# Patient Record
Sex: Male | Born: 1965 | Race: White | Hispanic: No | Marital: Married | Smoking: Current every day smoker
Health system: Southern US, Community
[De-identification: ages and names within clinical notes are randomized; demographics above are authoritative.]

## PROBLEM LIST (undated history)

## (undated) DIAGNOSIS — E7800 Pure hypercholesterolemia, unspecified: Secondary | ICD-10-CM

## (undated) DIAGNOSIS — T8859XA Other complications of anesthesia, initial encounter: Secondary | ICD-10-CM

## (undated) DIAGNOSIS — M5030 Other cervical disc degeneration, unspecified cervical region: Secondary | ICD-10-CM

## (undated) DIAGNOSIS — M419 Scoliosis, unspecified: Secondary | ICD-10-CM

## (undated) HISTORY — PX: ROTATOR CUFF REPAIR: SHX139

## (undated) HISTORY — PX: HERNIA REPAIR: SHX51

---

## 1998-07-21 ENCOUNTER — Emergency Department (HOSPITAL_COMMUNITY): Admission: EM | Admit: 1998-07-21 | Discharge: 1998-07-21 | Payer: Self-pay | Admitting: Emergency Medicine

## 2000-01-14 ENCOUNTER — Emergency Department (HOSPITAL_COMMUNITY): Admission: EM | Admit: 2000-01-14 | Discharge: 2000-01-14 | Payer: Self-pay | Admitting: *Deleted

## 2006-09-15 ENCOUNTER — Ambulatory Visit (HOSPITAL_COMMUNITY): Admission: RE | Admit: 2006-09-15 | Discharge: 2006-09-15 | Payer: Self-pay | Admitting: Surgery

## 2007-02-24 ENCOUNTER — Emergency Department (HOSPITAL_COMMUNITY): Admission: EM | Admit: 2007-02-24 | Discharge: 2007-02-25 | Payer: Self-pay | Admitting: Emergency Medicine

## 2007-02-25 ENCOUNTER — Inpatient Hospital Stay (HOSPITAL_COMMUNITY): Admission: EM | Admit: 2007-02-25 | Discharge: 2007-02-27 | Payer: Self-pay | Admitting: Emergency Medicine

## 2008-05-23 ENCOUNTER — Emergency Department (HOSPITAL_COMMUNITY): Admission: EM | Admit: 2008-05-23 | Discharge: 2008-05-23 | Payer: Self-pay | Admitting: Emergency Medicine

## 2011-03-11 NOTE — Discharge Summary (Signed)
NAME:  Derek Burns, Derek Burns NO.:  [id number]   MEDICAL RECORD NO.:  [id number]          PATIENT TYPE:  INP   LOCATION:  2007                         FACILITY:  [facility]   PHYSICIAN:  Derek Hilts. Jacinto Halim, MD       DATE OF BIRTH:  07/11/1966   DATE OF ADMISSION:  02/25/2007  DATE OF DISCHARGE:  02/27/2007                               DISCHARGE SUMMARY   HISTORY OF PRESENT ILLNESS:  [name] is a 45 year old male who came  in to the hospital because of chest pain.  He had partial relief of his  chest pain with Toradol.  He did have a significant family history, and  he smokes.  It was decided that he should undergo cardiac  catheterization because of continued chest pain.  His enzymes were  negative.  He understand a cardiac cath Feb 26, 2007.  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  Medical treatment was recommended.  He was seen  by Dr. Jacinto Burns on Feb 27, 2007.  He was considered stable for discharge  home.  His blood pressure was 147/87.  Dr. Jacinto Burns felt he could follow up  with his primary care doctor and he did not need to see Cardiology  unless she had further problems.   LABS:  Hemoglobin 13.5, hematocrit 40.3, platelets 164, WBC 7.6.  CK-MBs  and troponins were all negative.  His total cholesterol was 154,  triglycerides were 308, HDL is 28, LDL was 74.  CK-MBs and troponins  were all negative.  Chest x-ray showed no acute findings.   DISCHARGE MEDICATIONS:  1. Chantix 1 mg twice a day.  2. Pravastatin 40 mg at bedtime.  3. Aspirin 81 mg a day.  4. Enalapril 5 mg a day.  5. Relafen 500 mg twice per day.  6. Darvocet every 6 hours as needed for pain.   He should follow up with his primary care physician, Dr. Leanord Burns, in 2  weeks.  He should do no strenuous activity, lifting, pushing, pulling,  or exercising for 5 days.   DISCHARGE DIAGNOSES:  1. Chest pain, not cardiac ischemic related, status  post cardiac      catheterization with no obstructive disease.  2. Hypertension.  3. Positive family of coronary artery disease.  4. Tobacco smoking.  5. Hyperlipidemia.      Derek Burns, N.P.      Derek Hilts. Jacinto Halim, MD  Electronically Signed    BB/MEDQ  D:  03/29/2007  T:  03/30/2007  Job:  [id number]   cc:   Derek Burns, M.D.

## 2011-03-14 NOTE — Op Note (Signed)
NAME:  EZZARD, DITMER NO.:  [id number]   MEDICAL RECORD NO.:  [id number]          PATIENT TYPE:  AMB   LOCATION:  DAY                          FACILITY:  [facility]   PHYSICIAN:  Ardeth Sportsman, MD     DATE OF BIRTH:  [birth year]   DATE OF PROCEDURE:  09/15/2006  DATE OF DISCHARGE:                                 OPERATIVE REPORT   PREOPERATIVE DIAGNOSIS:  Left inguinal hernia, possible bilateral inguinal  hernias.   POSTOPERATIVE DIAGNOSES:  1. Left direct inguinal hernia.  2. Right indirect inguinal hernia.   OPERATION PERFORMED:  Laparoscopic preperitoneal repair of bilateral  inguinal hernias (TEP with 6 x 6 inch Parietex mesh).   SURGEON:  Ardeth Sportsman, MD   ASSISTANT:  Gita Kudo, M.D.   SPECIMENS:  None.   DRAINS:  None.   ESTIMATED BLOOD LOSS:  Less than 5 mL.   ANESTHESIA:  1. General.  2. Bilateral ilioinguinal and genitofemoral nerve blocks as well as cord      blocks as well as a field block around all port sites.   COMPLICATIONS:  None apparent.   INDICATIONS FOR PROCEDURE:  Mr. Lukacs is a 45 year old male with an  obvious left inguinal hernia and examination concerning for right inguinal  hernia.  The anatomy and embryology, abdominal wall formation, testicular  migration was explained.  Past physiology of inguinal herniation was  explained, recommendations made for hernia repair.  Options were discussed  and final recommendation for a laparoscopic preperitoneal repair was  explained.   Risks of stroke, heart attack, deep venous thrombosis, pulmonary embolism,  and death were discussed.  The risks such as bleeding, ecchymosis and need  for transfusion, wound infection, abscess, injury to other organs, hernia  recurrence, urinary retention requiring Foley catheterization, testicular  injury resulting in loss and other risks were discussed.  He understood  these risks and wished to proceed.   OPERATIVE FINDINGS:  He had an  obvious direct inguinal hernia on the left  side with no major incarceration occurring.  The right side he had indirect  hernia with a very large internal inguinal ring and a hernia sac coming up  into the ring with no evidence of any incarceration either.   DESCRIPTION OF PROCEDURE:  Informed consent was confirmed.  The patient  received preoperative antibiotics.  He underwent general anesthesia without  difficulty.  He was positioned supine with both arms tucked.  His abdomen  was prepped and draped in sterile fashion.   Entry was gained into the preperitoneal cavity through an infraumbilical  curvilinear incision about 2 cm long.  Anterior rectus sheath was nicked  both right and left of midline and the linea alba was freed off the  peritoneum.  A 10 mm port was passed posterior to rectus sheaths.  Capnopreperitoneum was induced to 15 mmHg.  Camera dissection was done to  help free the peritoneum off the right lower and left lower quadrants of the  abdomen such that 5 mm working ports could be placed in the right and left  mid abdomen.   Attention was  turned first to the left side, the obvious site.  The  peritoneum was freed off the anterior abdominal wall.  Fatty lipomas could  be seen going up into a defect medial to the inferior epigastric vessels and  just lateral to the insertion of the rectus abdominis muscles onto the pubis  bone consistent with a direct inguinal hernia.  The inguinal cord structures  could be identified.  There was some peritoneum crawling up on the cord  structures but not made it into the internal inguinal ring and thus this was  not a combined inguinal hernia.  Peritoneum was freed off the cord and  pelvic brim as inferoposteriorly as possible.  Window was made between the  posterior bladder and the posterior wall near the area of obturator  foramina.   Dissection was done in a mirror image fashion on the right side.  There was  no direct defect.  However, the internal inguinal ring was much more dilated  and there was obvious peritoneum crawling up with the inguinal structures  consistent with an indirect hernia.  This hernia sac was able to be freed  off as inferoposteriorly as possible.   6 x 6 polypropylene mesh was cut in a half-skull shape and placed one on  each side such that the medial inferior flap tucked covering the obturator  foramina of the posterior pelvic wall lateral to the bladder.  The  peritoneum was elevated cephalad as mesh was laid as posteriorly and  inferiorly as possible.  The peritoneum laid on top of it allowing the mesh  to be tucked well.  Mesh lay well laterally as well as superiorly and  medially such that there was at least three inches of coverage around the  left direct and the right indirect defects.  Lead point on especially the  left side was grasped and elevated cephalad as well as lead point on the  right side was elevated cephalad as well.  Capnopreperitoneum was released  with the mesh lying well. The fascial defect was closed using 0 Vicryl in a  figure-of-eight stitch.  Skin was closed using 4-0 Monocryl stitch.  Sterile  dressing was applied.  The patient was then sent to the recovery room in  stable condition.   I explained the operative findings to the patient's wife and family and  later to the patient himself.  Postoperative instructions were given.  They  expressed understanding and appreciation.   ADDENDUM:  On the right side a small defect was made into the peritoneum as  the indirect hernia sac was peeled down.  This was closed using a 0 Vicryl  stitch using intracorporeal suturing and tying.      Ardeth Sportsman, MD  Electronically Signed     SCG/MEDQ  D:  09/15/2006  T:  09/15/2006  Job:  [id number]

## 2011-03-14 NOTE — Cardiovascular Report (Signed)
NAME:  YOUSEF, HUGE.:  [id number]   MEDICAL RECORD NO.:  [id number]          PATIENT TYPE:  INP   LOCATION:  2007                         FACILITY:  [facility]   PHYSICIAN:  Nicki Guadalajara, M.D.     DATE OF BIRTH:  [birth year]   DATE OF PROCEDURE:  DATE OF DISCHARGE:                            CARDIAC CATHETERIZATION   INDICATIONS:  Derek Burns is a 45 year old gentleman who has  experienced recurrent episodes of chest pain.  There is a positive  history of tobacco use as well as family history of coronary artery  disease.  He had a mildly abnormal D-dimer and negative CT scan.  In  light of cardiac risk factors, definitive diagnostic cardiac  catheterization was recommended.   PROCEDURE:  After premedication with Versed for total of 3 mg  intravenously, the patient prepped and draped in usual fashion.  Right  femoral artery was punctured anteriorly and a 5-French sheath was  inserted without difficulty.  Diagnostic catheterization was done  utilizing 5-French  Judkins left and right coronary catheters.  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  Pigtail catheter was used for biplane cine left  ventriculography.  Central aortography was also performed to further  elucidate the anomalous left circumflex takeoff.  In addition, during  the left coronary injection, the patient did receive 200 mcg of  intracoronary nitroglycerin to see if the mild luminal narrowing of the  proximal LAD was spasm or potentially a fixed obstruction.  Hemostasis  was obtained by direct manual pressure.   HEMODYNAMIC DATA:  Central aortic pressure is 116/70, left ventricular  pressure 116/13.   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  There was a  proximal smooth 10-20% narrowing before the first septal perforating  artery.  The LAD gave rise to a large diagonal system.  The LAD extended  to the LV apex.  200 mcg IC nitroglycerin was administered down the LAD.  This did not significantly changed the mild luminal narrowing proximally  but additional dilation was noted in the remaining portion of the LAD  diagonal system.   The left circumflex vessel was a diminutive vessel which arose  anomalously from the origin of the right coronary artery and was  approximately a 1.0 size vessel.   The right coronary artery was a very large dominant vessel that gave  rise to a large acute marginal branch, a large posterior descending  artery extending to the apex, and a large bifurcating posterolateral  vessel supplying the posterior lateral wall.  The right coronary artery  and its branches were angiographically normal.   Biplane cine left ventriculography revealed normal LV contractility  without focal segmental wall motion abnormality.  Central aortography  was also performed which allowed improved visualization of the very  small circumflex vessel which arose off the right coronary artery.  The  vessel was very small caliber and gave rise to a diminutive small  marginal branch.   IMPRESSION:  1. Normal LV function.  2. Mild 10-20% smooth narrowing in the proximal LAD which was a single      vessel arising from the left coronary cusp with 10-20% narrowing      not significantly improved with intercoronary nitroglycerin      administration.  3. 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  4. Very large dominant right coronary artery.   RECOMMENDATIONS:  Medical therapy.  Most likely the patient's chest pain  was noncardiac in etiology.  The patient will require risk factor  modification.            ______________________________  Nicki Guadalajara, M.D.     TK/MEDQ  D:  02/26/2007  T:  02/26/2007  Job:  [id number]   cc:   Dr. Len Childs, M.D.

## 2011-04-08 ENCOUNTER — Emergency Department (HOSPITAL_COMMUNITY)
Admission: EM | Admit: 2011-04-08 | Discharge: 2011-04-08 | Disposition: A | Discharge status: Home / Self Care | Payer: Self-pay | Attending: Emergency Medicine | Admitting: Emergency Medicine

## 2011-04-08 DIAGNOSIS — S61209A Unspecified open wound of unspecified finger without damage to nail, initial encounter: Secondary | ICD-10-CM | POA: Insufficient documentation

## 2011-04-08 DIAGNOSIS — W268XXA Contact with other sharp object(s), not elsewhere classified, initial encounter: Secondary | ICD-10-CM | POA: Insufficient documentation

## 2011-07-25 LAB — POCT I-STAT, CHEM 8
BUN: 12
Calcium, Ion: 1.06 — ABNORMAL LOW
HCT: 51
Sodium: 139
TCO2: 26

## 2012-11-10 ENCOUNTER — Encounter (HOSPITAL_COMMUNITY): Payer: Self-pay | Admitting: Emergency Medicine

## 2012-11-10 ENCOUNTER — Emergency Department (HOSPITAL_COMMUNITY)
Admission: EM | Admit: 2012-11-10 | Discharge: 2012-11-10 | Disposition: A | Discharge status: Home / Self Care | Payer: PRIVATE HEALTH INSURANCE | Attending: Emergency Medicine | Admitting: Emergency Medicine

## 2012-11-10 ENCOUNTER — Emergency Department (HOSPITAL_COMMUNITY): Payer: PRIVATE HEALTH INSURANCE

## 2012-11-10 DIAGNOSIS — W268XXA Contact with other sharp object(s), not elsewhere classified, initial encounter: Secondary | ICD-10-CM | POA: Insufficient documentation

## 2012-11-10 DIAGNOSIS — F17200 Nicotine dependence, unspecified, uncomplicated: Secondary | ICD-10-CM | POA: Insufficient documentation

## 2012-11-10 DIAGNOSIS — Y939 Activity, unspecified: Secondary | ICD-10-CM | POA: Insufficient documentation

## 2012-11-10 DIAGNOSIS — Y929 Unspecified place or not applicable: Secondary | ICD-10-CM | POA: Insufficient documentation

## 2012-11-10 DIAGNOSIS — Z8639 Personal history of other endocrine, nutritional and metabolic disease: Secondary | ICD-10-CM | POA: Insufficient documentation

## 2012-11-10 DIAGNOSIS — S61111A Laceration without foreign body of right thumb with damage to nail, initial encounter: Secondary | ICD-10-CM

## 2012-11-10 DIAGNOSIS — S61209A Unspecified open wound of unspecified finger without damage to nail, initial encounter: Secondary | ICD-10-CM | POA: Insufficient documentation

## 2012-11-10 DIAGNOSIS — Z862 Personal history of diseases of the blood and blood-forming organs and certain disorders involving the immune mechanism: Secondary | ICD-10-CM | POA: Insufficient documentation

## 2012-11-10 DIAGNOSIS — R209 Unspecified disturbances of skin sensation: Secondary | ICD-10-CM | POA: Insufficient documentation

## 2012-11-10 HISTORY — DX: Pure hypercholesterolemia, unspecified: E78.00

## 2012-11-10 MED ORDER — ONDANSETRON 4 MG PO TBDP
4.0000 mg | ORAL_TABLET | Freq: Once | ORAL | Status: AC
  Administered 2012-11-10: 4 mg via ORAL
  Filled 2012-11-10: qty 1

## 2012-11-10 MED ORDER — LIDOCAINE HCL 2 % IJ SOLN
10.0000 mL | Freq: Once | INTRAMUSCULAR | Status: AC
  Administered 2012-11-10: 200 mg via INTRADERMAL

## 2012-11-10 MED ORDER — CEPHALEXIN 500 MG PO CAPS: 500.0000 mg | ORAL_CAPSULE | Freq: Four times a day (QID) | ORAL | Status: DC

## 2012-11-10 MED ORDER — OXYCODONE-ACETAMINOPHEN 5-325 MG PO TABS
1.0000 | ORAL_TABLET | Freq: Once | ORAL | Status: AC
  Administered 2012-11-10: 1 via ORAL
  Filled 2012-11-10: qty 1

## 2012-11-10 MED ORDER — OXYCODONE-ACETAMINOPHEN 5-325 MG PO TABS: 1.0000 | ORAL_TABLET | Freq: Four times a day (QID) | ORAL | Status: DC | PRN

## 2012-11-10 NOTE — ED Notes (Signed)
Patient states feels slight nausea. States might be hungry. PA notified. States last tetanus approximately 5 years ago stated by patient and family member now at bedside.

## 2012-11-10 NOTE — ED Provider Notes (Signed)
History     CSN: [id number]  Arrival date & time 11/10/12  1039   First MD Initiated Contact with Patient 11/10/12 1053      Chief Complaint  Patient presents with  . Laceration    (Consider location/radiation/quality/duration/timing/severity/associated sxs/prior treatment) Patient is a 47 y.o. male presenting with skin laceration. The history is provided by the patient. No language interpreter was used.  Laceration  The incident occurred less than 1 hour ago. Pain location: right thumb. The laceration is 4 cm in size. The laceration mechanism was a a blunt object (changing a strut). The pain is at a severity of 10/10. The pain is severe. The pain has been constant since onset. He reports no foreign bodies present. His tetanus status is UTD.    Past Medical History  Diagnosis Date  . High cholesterol     No past surgical history on file.  No family history on file.  History  Substance Use Topics  . Smoking status: Current Every Day Smoker  . Smokeless tobacco: Not on file  . Alcohol Use:       Review of Systems  Constitutional: Negative for fever.  Skin: Positive for wound. Negative for rash.  Neurological: Positive for numbness.    Allergies  Review of patient's allergies indicates no known allergies.  Home Medications  No current outpatient prescriptions on file.  BP 144/83  Pulse 98  Temp 98.6 F (37 C)  Resp 16  SpO2 99%  Physical Exam  Nursing note and vitals reviewed. Constitutional: He is oriented to person, place, and time. He appears well-developed and well-nourished. No distress.  HENT:  Head: Atraumatic.  Eyes: Conjunctivae normal are normal.  Neck: Neck supple.  Musculoskeletal: Normal range of motion. He exhibits tenderness (R thumb: 4cm circumferential laceration to tip of finger with nail involvement, in flap formation. no joint involvement decreased sensation distally).  Neurological: He is alert and oriented to person, place, and time.   Skin: Skin is warm.  Psychiatric: He has a normal mood and affect.    ED Course  LACERATION REPAIR Date/Time: 11/10/2012 12:10 PM Performed by: Fayrene Helper Authorized by: Fayrene Helper Consent: Verbal consent obtained. Written consent not obtained. Risks and benefits: risks, benefits and alternatives were discussed Consent given by: patient Patient understanding: patient states understanding of the procedure being performed Patient consent: the patient's understanding of the procedure matches consent given Procedure consent: procedure consent matches procedure scheduled Relevant documents: relevant documents present and verified Site marked: the operative site was marked Imaging studies: imaging studies available Patient identity confirmed: verbally with patient and arm band Time out: Immediately prior to procedure a "time out" was called to verify the correct patient, procedure, equipment, support staff and site/side marked as required. Location: R thumb. Laceration length: 4 cm Contamination: The wound is contaminated. Foreign bodies: no foreign bodies Tendon involvement: none Nerve involvement: superficial Vascular damage: yes Anesthesia: nerve block Local anesthetic: lidocaine 2% without epinephrine Anesthetic total: 8 ml Preparation: Patient was prepped and draped in the usual sterile fashion. Irrigation solution: saline Irrigation method: syringe Amount of cleaning: extensive Debridement: minimal Degree of undermining: minimal Skin closure: 4-0 nylon Number of sutures: 8 Technique: simple Approximation: close Approximation difficulty: simple Dressing: 4x4 sterile gauze, non-adhesive packing strip, pressure dressing and splint Patient tolerance: Patient tolerated the procedure well with no immediate complications.   (including critical care time)  Labs Reviewed - No data to display Dg Finger Thumb Right  11/10/2012  *RADIOLOGY REPORT*  Clinical  Data: Distal thumb  injury  RIGHT THUMB 2+V  Comparison: None.  Findings: Three views of the right thumb submitted.  There is a vague lucent line at the base of the distal phalanx suspicious for nondisplaced fracture.  Clinical correlation is necessary.  IMPRESSION: Vague lucent line at the base of distal phalanx suspicious for nondisplaced fracture.  Clinical correlation is necessary.   Original Report Authenticated By: Natasha Mead, M.D.      No diagnosis found.  1. R thumb laceration, nondisplaced fx.  MDM  Pt injured his R thumb when he was changing a car strut.  Suffered a flap injury to distal tip of R thumb with near complete laceration.  Has mild paresthesia distal to site and tip look dusky.  Wound were thoroughly irrigated.  Xray shows suspect nondisplaced fx.  Pt has no subungal hematoma.  Wound were sutured with good alignment.  Will dc with finger splint, abx, pain meds and referral to hand.  Pt aware to return in 10 days for sutures removal.  To return sooner if there are signs of infection.  Care discussed with my attending.  4:24 PM Attempted to contact hand specialist without return call.  Pt is made aware to f/u with hand specialist, Dr. Melvyn Novas.  Wife acknowledge the importance of f/u.    BP 144/83  Pulse 98  Temp 98.6 F (37 C)  Resp 16  SpO2 99%  I have reviewed nursing notes and vital signs. I personally reviewed the imaging tests through PACS system  I reviewed available ER/hospitalization records thought the EMR       Fayrene Helper, PA-C 11/10/12 1214  Fayrene Helper, PA-C 11/10/12 1629

## 2012-11-10 NOTE — Progress Notes (Signed)
Orthopedic Tech Progress Note Patient Details:  Derek Burns May 15, 1966 [id number]  Ortho Devices Type of Ortho Device: Finger splint Ortho Device/Splint Location: RIGHT THUMB Ortho Device/Splint Interventions: Application   Cammer, Mickie Bail 11/10/2012, 1:05 PM

## 2012-11-10 NOTE — ED Notes (Signed)
Rt thumb got smashed in a strut that broke lose  Bleeding oozing athis time   Tip of rt thumb is numb at this time

## 2012-11-10 NOTE — ED Notes (Signed)
Cleaned right thumb with wound cleanser again after suture placement and placed bacitracin on right thumb. Patient tolerated without incident.

## 2012-11-10 NOTE — ED Notes (Signed)
Patient transported to X-ray 

## 2012-11-10 NOTE — ED Provider Notes (Signed)
Medical screening examination/treatment/procedure(s) were performed by non-physician practitioner and as supervising physician I was immediately available for consultation/collaboration.  Glynn Octave, MD 11/10/12 [phone]

## 2014-04-07 ENCOUNTER — Other Ambulatory Visit: Payer: Self-pay | Admitting: Orthopaedic Surgery

## 2014-04-07 DIAGNOSIS — M545 Low back pain, unspecified: Secondary | ICD-10-CM

## 2014-04-11 ENCOUNTER — Ambulatory Visit
Admission: RE | Admit: 2014-04-11 | Discharge: 2014-04-11 | Disposition: A | Discharge status: Home / Self Care | Payer: PRIVATE HEALTH INSURANCE | Source: Ambulatory Visit | Attending: Orthopaedic Surgery | Admitting: Orthopaedic Surgery

## 2014-04-11 DIAGNOSIS — M545 Low back pain, unspecified: Secondary | ICD-10-CM

## 2014-08-19 ENCOUNTER — Emergency Department (HOSPITAL_COMMUNITY)
Admission: EM | Admit: 2014-08-19 | Discharge: 2014-08-19 | Disposition: A | Discharge status: Home / Self Care | Payer: 59 | Attending: Emergency Medicine | Admitting: Emergency Medicine

## 2014-08-19 ENCOUNTER — Emergency Department (HOSPITAL_COMMUNITY): Payer: 59

## 2014-08-19 ENCOUNTER — Encounter (HOSPITAL_COMMUNITY): Payer: Self-pay | Admitting: Emergency Medicine

## 2014-08-19 DIAGNOSIS — Y9389 Activity, other specified: Secondary | ICD-10-CM | POA: Insufficient documentation

## 2014-08-19 DIAGNOSIS — S90112A Contusion of left great toe without damage to nail, initial encounter: Secondary | ICD-10-CM | POA: Diagnosis not present

## 2014-08-19 DIAGNOSIS — S99922A Unspecified injury of left foot, initial encounter: Secondary | ICD-10-CM | POA: Diagnosis present

## 2014-08-19 DIAGNOSIS — Z8639 Personal history of other endocrine, nutritional and metabolic disease: Secondary | ICD-10-CM | POA: Diagnosis not present

## 2014-08-19 DIAGNOSIS — S99929A Unspecified injury of unspecified foot, initial encounter: Secondary | ICD-10-CM

## 2014-08-19 DIAGNOSIS — Z720 Tobacco use: Secondary | ICD-10-CM | POA: Insufficient documentation

## 2014-08-19 DIAGNOSIS — M25572 Pain in left ankle and joints of left foot: Secondary | ICD-10-CM

## 2014-08-19 DIAGNOSIS — M79672 Pain in left foot: Secondary | ICD-10-CM

## 2014-08-19 DIAGNOSIS — Y92488 Other paved roadways as the place of occurrence of the external cause: Secondary | ICD-10-CM | POA: Insufficient documentation

## 2014-08-19 MED ORDER — IBUPROFEN 600 MG PO TABS: 600.0000 mg | ORAL_TABLET | Freq: Four times a day (QID) | ORAL | Status: DC | PRN

## 2014-08-19 MED ORDER — OXYCODONE-ACETAMINOPHEN 5-325 MG PO TABS
1.0000 | ORAL_TABLET | Freq: Once | ORAL | Status: AC
  Administered 2014-08-19: 1 via ORAL
  Filled 2014-08-19: qty 1
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
## 2014-08-19 NOTE — ED Notes (Signed)
Ankle x-ray ordered because PT now reports ankle pain.

## 2014-08-19 NOTE — Discharge Instructions (Signed)
Subungual Hematoma A subungual hematoma is a pocket of blood that collects under the fingernail or toenail. The pressure created by the blood under the nail can cause pain. CAUSES  A subungual hematoma occurs when an injury to the finger or toe causes a blood vessel beneath the nail to break. The injury can occur from a direct blow such as slamming a finger in a door. It can also occur from a repeated injury such as pressure on the foot in a shoe while running. A subungual hematoma is sometimes called runner's toe or tennis toe. SYMPTOMS   Blue or dark blue skin under the nail.  Pain or throbbing in the injured area. DIAGNOSIS  Your caregiver can determine whether you have a subungual hematoma based on your history and a physical exam. If your caregiver thinks you might have a broken (fractured) bone, X-rays may be taken. TREATMENT  Hematomas usually go away on their own over time. Your caregiver may make a hole in the nail to drain the blood. Draining the blood is painless and usually provides significant relief from pain and throbbing. The nail usually grows back normally after this procedure. In some cases, the nail may need to be removed. This is done if there is a cut under the nail that requires stitches (sutures). HOME CARE INSTRUCTIONS   Put ice on the injured area.  Put ice in a plastic bag.  Place a towel between your skin and the bag.  Leave the ice on for 15-20 minutes, 03-04 times a day for the first 1 to 2 days.  Elevate the injured area to help decrease pain and swelling.  If you were given a bandage, wear it for as long as directed by your caregiver.  If part of your nail falls off, trim the remaining nail gently. This prevents the nail from catching on something and causing further injury.  Only take over-the-counter or prescription medicines for pain, discomfort, or fever as directed by your caregiver. SEEK IMMEDIATE MEDICAL CARE IF:   You have redness or swelling  around the nail.  You have yellowish-white fluid (pus) coming from the nail.  Your pain is not controlled with medicine.  You have a fever. MAKE SURE YOU:  Understand these instructions.  Will watch your condition.  Will get help right away if you are not doing well or get worse. Document Released: 10/10/2000 Document Revised: 01/05/2012 Document Reviewed: 10/01/2011 [facility] Patient Information 2015 [place], [place]. This information is not intended to replace advice given to you by your health care provider. Make sure you discuss any questions you have with your health care provider.  Ankle Pain Ankle pain is a common symptom. The bones, cartilage, tendons, and muscles of the ankle joint perform a lot of work each day. The ankle joint holds your body weight and allows you to move around. Ankle pain can occur on either side or back of 1 or both ankles. Ankle pain may be sharp and burning or dull and aching. There may be tenderness, stiffness, redness, or warmth around the ankle. The pain occurs more often when a person walks or puts pressure on the ankle. CAUSES  There are many reasons ankle pain can develop. It is important to work with your caregiver to identify the cause since many conditions can impact the bones, cartilage, muscles, and tendons. Causes for ankle pain include:  Injury, including a break (fracture), sprain, or strain often due to a fall, sports, or a high-impact activity.  Swelling (  inflammation) of a tendon (tendonitis).  Achilles tendon rupture.  Ankle instability after repeated sprains and strains.  Poor foot alignment.  Pressure on a nerve (tarsal tunnel syndrome).  Arthritis in the ankle or the lining of the ankle.  Crystal formation in the ankle (gout or pseudogout). DIAGNOSIS  A diagnosis is based on your medical history, your symptoms, results of your physical exam, and results of diagnostic tests. Diagnostic tests may include X-ray exams or a  computerized magnetic scan (magnetic resonance imaging, MRI). TREATMENT  Treatment will depend on the cause of your ankle pain and may include:  Keeping pressure off the ankle and limiting activities.  Using crutches or other walking support (a cane or brace).  Using rest, ice, compression, and elevation.  Participating in physical therapy or home exercises.  Wearing shoe inserts or special shoes.  Losing weight.  Taking medications to reduce pain or swelling or receiving an injection.  Undergoing surgery. HOME CARE INSTRUCTIONS   Only take over-the-counter or prescription medicines for pain, discomfort, or fever as directed by your caregiver.  Put ice on the injured area.  Put ice in a plastic bag.  Place a towel between your skin and the bag.  Leave the ice on for 15-20 minutes at a time, 03-04 times a day.  Keep your leg raised (elevated) when possible to lessen swelling.  Avoid activities that cause ankle pain.  Follow specific exercises as directed by your caregiver.  Record how often you have ankle pain, the location of the pain, and what it feels like. This information may be helpful to you and your caregiver.  Ask your caregiver about returning to work or sports and whether you should drive.  Follow up with your caregiver for further examination, therapy, or testing as directed. SEEK MEDICAL CARE IF:   Pain or swelling continues or worsens beyond 1 week.  You have an oral temperature above 102 F (38.9 C).  You are feeling unwell or have chills.  You are having an increasingly difficult time with walking.  You have loss of sensation or other new symptoms.  You have questions or concerns. MAKE SURE YOU:   Understand these instructions.  Will watch your condition.  Will get help right away if you are not doing well or get worse. Document Released: 04/02/2010 Document Revised: 01/05/2012 Document Reviewed: 04/02/2010 [facility] Patient Information  2015 [place], [place]. This information is not intended to replace advice given to you by your health care provider. Make sure you discuss any questions you have with your health care provider.   Emergency Department Resource Guide 1) Find a Doctor and Pay Out of Pocket Although you won't have to find out who is covered by your insurance plan, it is a good idea to ask around and get recommendations. You will then need to call the office and see if the doctor you have chosen will accept you as a new patient and what types of options they offer for patients who are self-pay. Some doctors offer discounts or will set up payment plans for their patients who do not have insurance, but you will need to ask so you aren't surprised when you get to your appointment.  2) Contact Your Local Health Department Not all health departments have doctors that can see patients for sick visits, but many do, so it is worth a call to see if yours does. If you don't know where your local health department is, you can check in your phone book. The  CDC also has a tool to help you locate your state's health department, and many state websites also have listings of all of their local health departments.  3) Find a Walk-in Clinic If your illness is not likely to be very severe or complicated, you may want to try a walk in clinic. These are popping up all over the country in pharmacies, drugstores, and shopping centers. They're usually staffed by nurse practitioners or physician assistants that have been trained to treat common illnesses and complaints. They're usually fairly quick and inexpensive. However, if you have serious medical issues or chronic medical problems, these are probably not your best option.  No Primary Care Doctor: - Call Health Connect at  [phone] - they can help you locate a primary care doctor that  accepts your insurance, provides certain services, etc. - Physician Referral Service- [phone]  Chronic  Pain Problems: Organization         Address  Phone   Notes  [facility]  [phone] Patients need to be referred by their primary care doctor.   Medication Assistance: Organization         Address  Phone   Notes  [facility] Medication Lifescapessistance Program [address] [place], [place] [address] [phone]) [phone] --Must be a resident of [facility] [place] -- Must have NO insurance coverage whatsoever (no Medicaid/ Medicare, etc.) -- The pt. MUST have a primary care doctor that directs their care regularly and follows them in the community   MedAssist  [phone]) [phone]   [employer]  [phone]    Agencies that provide inexpensive medical care: Organization         Address  Phone   Notes  [facility]  [phone]) [phone]   [facility]    [phone]   [facility] [address] [place], [place] [address] [phone]   [facility] [address], [place] [phone]) [phone]   [facility]anned [facility]    [phone]) [phone]   [facility]    [phone]   [facility]  [address], [place] Phone:  [phone], Fax:  [phone] Hours of Operation:  9 am - 6 pm, M-F.  Also accepts Medicaid/Medicare and self-pay.  [facility]  [address], [place] Phone: [phone], Fax: [phone]. Hours of Operation:  8:30 am - 5:30 pm, M-F.  Also accepts Medicaid and self-pay.  [facility] [place] [address], [place] Phone: [phone](336) [phone]   [facility] [address], [place], [place] [phone], Ext. [phone] Mondays & Thursdays: 7-9 AM.  First 15 patients are seen on a first come, first serve basis.    Medicaid-accepting [facility] Providers:  Organization         Address  Phone   Notes  [facility] [address], [place] [phone] Also  accepts self-pay patients.  [facility] [address], [place]  [phone]   [facility] [address], [place] [phone]) [phone]   [facility] [address] [place] [address], [place] [phone]) [phone]   Renaye RakersVeita Bland [address], [place]   [phone]) [phone] Only accepts [place] Access [place] patients after they have their name applied to their card.   Self-Pay (no  insurance) in [place]:  Organization         Address  Phone   Notes  Sickle Cell Patients, [facility] [address] [place], [place] [phone]   [facility] [address] [place], [place] [phone]   [facility]  [address], [place] [phone]   [facility]/Dr. Osei-Bonsu  [address], [place] or [address], [place] [phone] Phone number for both [place] and [place] locations is the same.  Urgent Medical and [facility] [address], [place] [phone]   [facility] [address] [place] or [address] [phone]   [facility] [address], [place] [phone], phone; [phone], fax Sees patients 1st and 3rd Saturday of every month.  Must not qualify for public or private insurance (i.e. Medicaid, Medicare, [place] Health Choice, Veterans' Benefits)  Household income should be no more than 200% of the poverty level The clinic cannot treat you if you are pregnant or think you are pregnant  Sexually transmitted diseases are not treated at the clinic.    Dental Care: Organization         Address  Phone  Notes  [facility] of [facility] [address] [place], [place] [phone] Accepts children up to age 13 who are enrolled in [place] or [place] Health Choice; pregnant  women with a Medicaid card; and children who have applied for Medicaid or [place] Health Choice, but were declined, whose parents can pay a reduced fee at time of service.  [facility] Department of [facility]  [address], [place] [phone] Accepts children up to age 50 who are enrolled in [place] or [place] Health Choice; pregnant women with a Medicaid card; and children who have applied for Medicaid or [place] Health Choice, but were declined, whose parents can pay a reduced fee at time of service.  [facility]  [address] [place], [place] [phone] Patients are seen by appointment only. Walk-ins are not accepted. [facility] will see patients 56 years of age and older. Monday - Tuesday (8am-5pm) Most Wednesdays (8:30-5pm) $30 per visit, cash only  [facility] Adult Dental Access PROGRAM  [address], [facility] [phone] Patients are seen by appointment only. Walk-ins are not accepted. [facility] will see patients 39 years of age and older. One Wednesday Evening (Monthly: Volunteer Based).  $30 per visit, cash only  [employer]  [phone] for adults; Children under age 35, call Graduate Pediatric Dentistry at [phone]. Children aged 91-14, please call [phone] to request a pediatric application.  Dental services are provided in all areas of dental care including fillings, crowns and bridges, complete and partial dentures, implants, gum treatment, root canals, and extractions. Preventive care is also provided. Treatment is provided to both adults and children. Patients are selected via a lottery and there is often a waiting list.   [facility] [address], [place]  [phone] [email or web address]   [facility] [address] [place], [place] [phone], Ext. [phone] Second and Fourth Thursday of each month, opens at 6:30 AM; Clinic ends at 9 AM.  Patients are  seen on a first-come first-served basis, and a limited number are seen  during each clinic.   [facility]  [address] [place], [place] [phone]   Eligibility Requirements You must have lived in [place], [place], or [place] counties for at least the last three months.   You cannot be eligible for state or federal sponsored [place], including [employer], [place], or [employer].   You generally cannot be eligible for healthcare insurance through your employer.    How to apply: Eligibility screenings are held every Tuesday and Wednesday afternoon from 1:00 pm until 4:00 pm. You do not need an appointment for the interview!  [facility] [address] [place], [place] [phone]   [facility] Health Department  [phone]   [facility] Health Department  [phone]   [facility] Health Department  [phone]    Behavioral Health Resources in the Community: Intensive Outpatient Programs Organization         Address  Phone  Notes  [facility] [address] [place], [place] [phone]   [facility] Outpatient [address] [place], [place] [phone]   [facility]: [facility] [address] [place], [place]  [phone]   [facility] [address]  [place], [place] [phone] or [phone]   Substance Abuse Resources Organization         Address  Phone  Notes  Al[facility] Drug Services  [phone]   [facility]  [phone]   [facility] [place]  [phone]   Floydene Flock  [phone]   Residential & Outpatient Substance Abuse Program  [phone]   Psychological Services Organization         Address  Phone  Notes  [facility] Behavioral Health  [phone]   [facility] Services  [phone]   [facility] Mental Health [address] [place] [phone] or [phone]    Mobile Crisis  Teams Organization         Address  Phone  Notes  [facility], Mobile Crisis Care Unit  [phone]   [facility]  [address] [place], [place] [phone]   Doristine Locks [address] [place] [phone]    Self-Help/Support Groups Organization         Address  Phone             Notes  [facility] [place] - variety of support groups  336- I7437963 Call for more information  Narcotics Anonymous (NA), Caring [facility] [address], [employer] [place]  2 meetings at this location   Statistician         Address  Phone  Notes  [facility] [address],    [place]  [phone]   [facility]  [address] [place] [address], [place], [place] [phone]   [facility] [address] [place], [place] [phone] Admissions: 8am-3pm M-F  [facility] [address]    [place], [place] [phone]   [facility] [address] [place]s [place], [place] [phone]   [facility] [address],  [place], [place] [phone]   [facility] - Intensive Outpatient [address], [place], [place] [phone]   [facility] (Addiction Recovery [facility] [address]  [place], [place] [phone] or 3150790301   [facility] ([facility]) [address], [place], [place] [phone] Accepts Medicaid  [facility] [address]  [place]  [place] [phone] Substance Abuse/Addiction Treatment   [facility] Organization         Address  Phone  Notes  [facility]  [phone]   Angie Fava, PhD [address] Ervin Knack [place], [place]   [phone] or [phone]   [facility] Behavioral   [address] [place], [place] [phone]   [facility] [address] [place], [place] [phone]  Insurance/Medicaid/sponsorship through [facility] and Families [address]                                    [place], [place] [phone] Therapy/tele-psych/case  [facility] [address], [place] [phone]    Dr. Lolly Mustache  [phone]   [facility] [place]  [employer] [facility] Dept. 1) [address] [place] 2) [address] [place] 3)  [address], [place] [phone]  [phone]   [facility] Child Abuse Hotline 917-762-8135 or [phone] (After Hours)
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
## 2014-08-22 NOTE — ED Provider Notes (Signed)
Medical screening examination/treatment/procedure(s) were performed by non-physician practitioner and as supervising physician I was immediately available for consultation/collaboration.    Keary Hanak, MD 08/22/14 1518 

## 2015-07-10 IMAGING — CR DG ORBITS FOR FOREIGN BODY
2 series · 2 of 2 positions shown · non-contrast
Comparison: None.

CLINICAL DATA: Metal working/exposure; clearance prior to MRI

EXAM:
ORBITS FOR FOREIGN BODY - 2 VIEW

[view not recorded (1 of 2)]
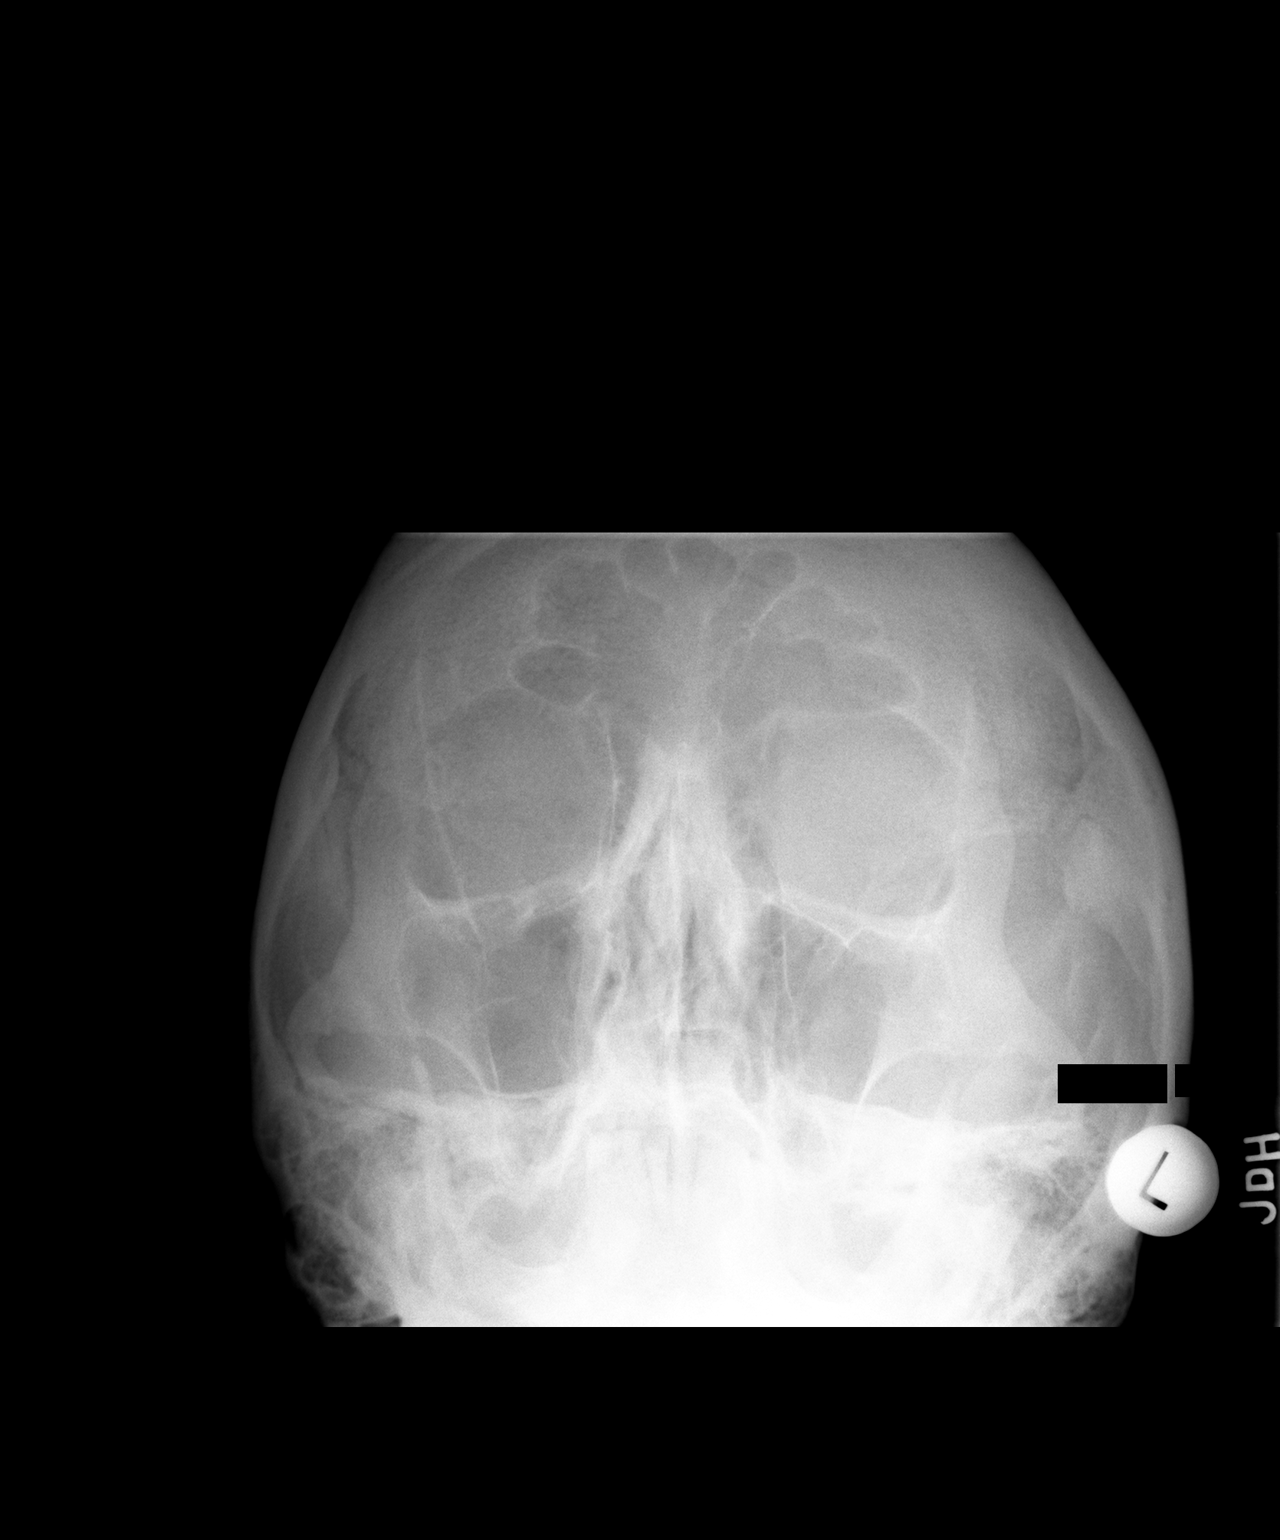

[view not recorded (2 of 2)]
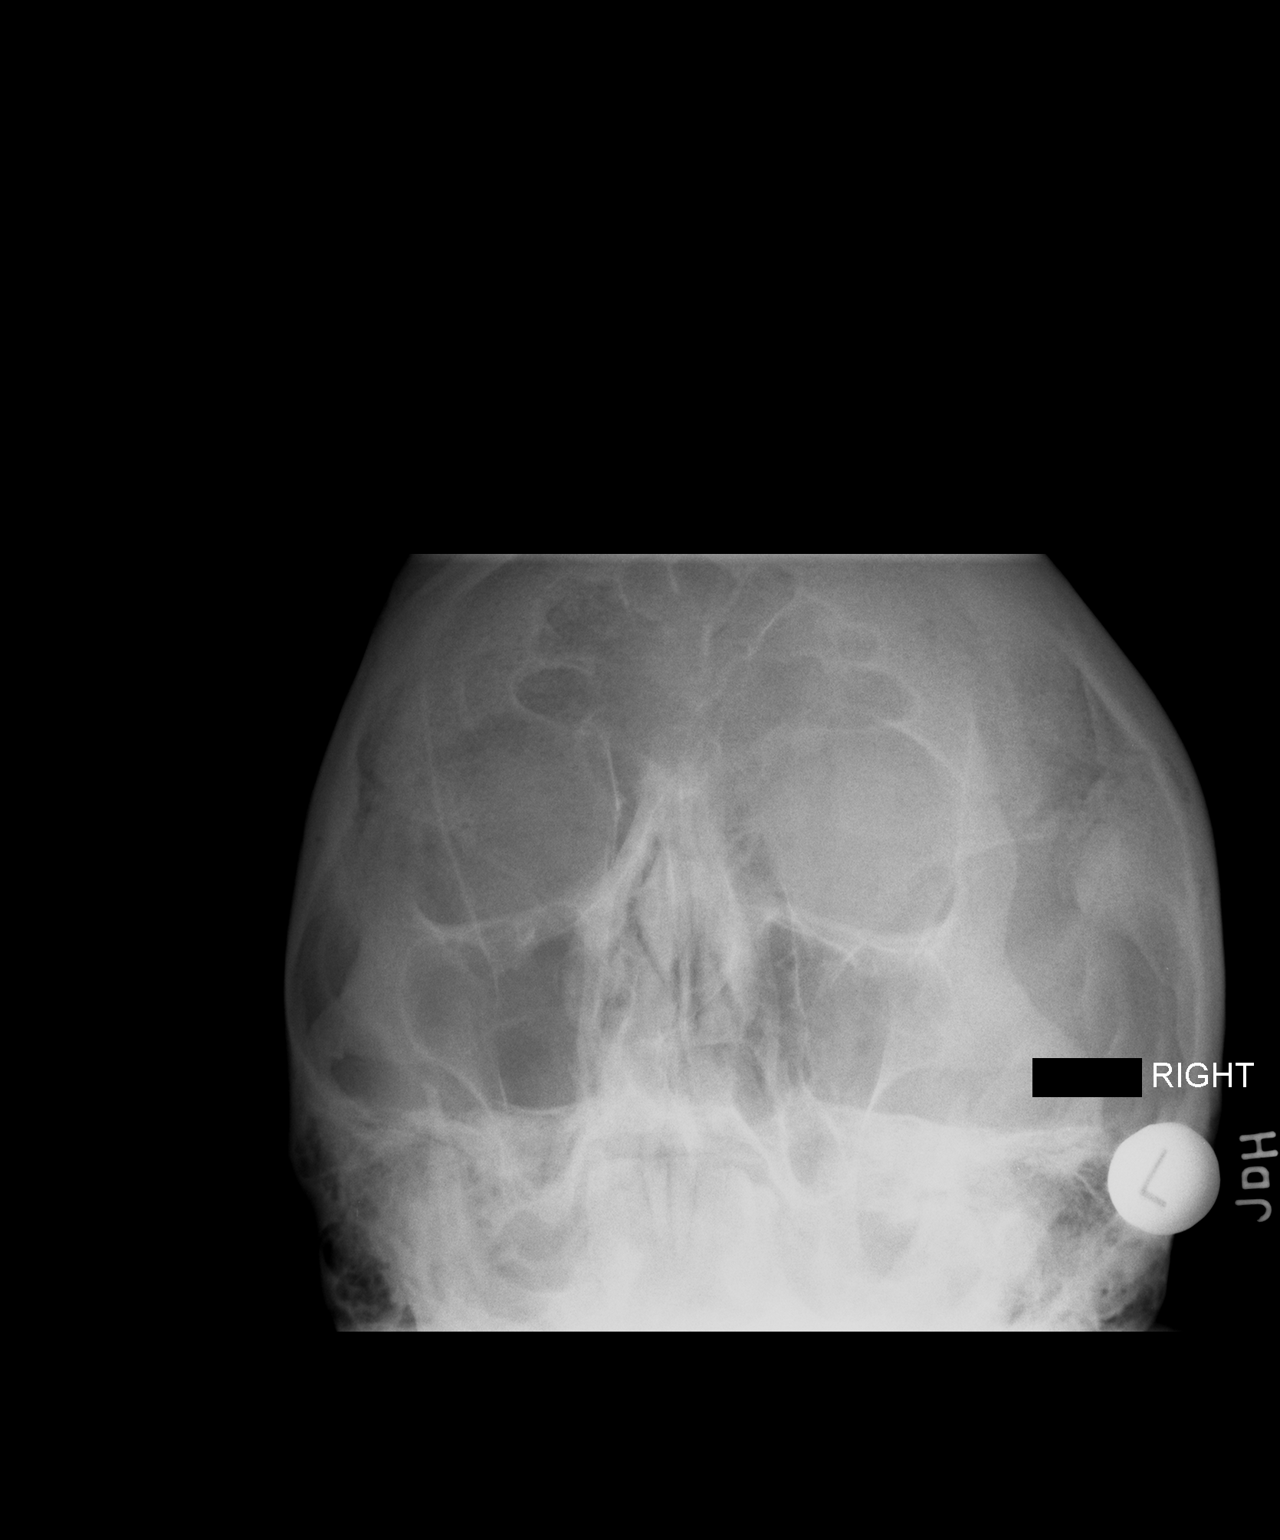

[2 of 2 positions shown; findings below may reference images not displayed]

FINDINGS: There is no evidence of metallic foreign body within the orbits. No
significant bone abnormality identified.
IMPRESSION: No evidence of metallic foreign body within the orbits.

## 2017-01-07 ENCOUNTER — Telehealth (INDEPENDENT_AMBULATORY_CARE_PROVIDER_SITE_OTHER): Payer: Self-pay | Admitting: Orthopaedic Surgery

## 2017-01-07 NOTE — Telephone Encounter (Signed)
Records 10-27-2013-present faxed DDS [phone]

## 2017-06-04 ENCOUNTER — Encounter (HOSPITAL_COMMUNITY): Payer: Self-pay | Admitting: Emergency Medicine

## 2017-06-04 ENCOUNTER — Ambulatory Visit (HOSPITAL_COMMUNITY)
Admission: EM | Admit: 2017-06-04 | Discharge: 2017-06-04 | Disposition: A | Discharge status: Home / Self Care | Payer: PRIVATE HEALTH INSURANCE | Attending: Internal Medicine | Admitting: Internal Medicine

## 2017-06-04 DIAGNOSIS — S46912A Strain of unspecified muscle, fascia and tendon at shoulder and upper arm level, left arm, initial encounter: Secondary | ICD-10-CM

## 2017-06-04 DIAGNOSIS — R202 Paresthesia of skin: Secondary | ICD-10-CM | POA: Diagnosis not present

## 2017-06-04 HISTORY — DX: Scoliosis, unspecified: M41.9

## 2017-06-04 HISTORY — DX: Other cervical disc degeneration, unspecified cervical region: M50.30

## 2017-06-04 MED ORDER — NAPROXEN 500 MG PO TABS: 500.0000 mg | ORAL_TABLET | Freq: Two times a day (BID) | ORAL | 0 refills | Status: AC

## 2017-06-04 MED ORDER — METHYLPREDNISOLONE SODIUM SUCC 125 MG IJ SOLR
125.0000 mg | Freq: Once | INTRAMUSCULAR | Status: AC
  Administered 2017-06-04: 125 mg via INTRAMUSCULAR

## 2017-06-04 MED ORDER — METHYLPREDNISOLONE SODIUM SUCC 125 MG IJ SOLR
INTRAMUSCULAR | Status: AC
  Filled 2017-06-04: qty 2

## 2017-06-04 NOTE — ED Provider Notes (Signed)
[facility]    CSN: [id number] Arrival date & time: 06/04/17  1000     History   Chief Complaint Chief Complaint  Patient presents with  . Neck Pain    HPI Derek Burns is a 51 y.o. male.   51 year old male with history of cervical degenerative disc disease, hyperlipidemia, scoliosis comes in for 2 month history of left arm numbness/tingling. He states he first thought it might have been due to his sleeping position, but has since worsened. States he now chest to sleep on his back, denies compression of the left arm. Numbness from the left shoulder down the deltoid. He denies any heavy lifting, repetitive motions, injury. He states the numbness/tingling is constant, but can change due to position. Heat compresses help with the pain/numbness. He has been taking Goody powder with little relief. Denies numbness tingling of the fingers, changes of finger color, changes with head movement.      Past Medical History:  Diagnosis Date  . DDD (degenerative disc disease), cervical   . High cholesterol   . Scoliosis     There are no active problems to display for this patient.   No past surgical history on file.     Home Medications    Prior to Admission medications   Medication Sig Start Date End Date Taking? Authorizing Provider  aspirin 325 MG tablet Take 325 mg by mouth daily.   Yes [provider]  ibuprofen (ADVIL,MOTRIN) 600 MG tablet Take 1 tablet (600 mg total) by mouth every 6 (six) hours as needed. 08/19/14   Ladona Mow, PA-C  naproxen (NAPROSYN) 500 MG tablet Take 1 tablet (500 mg total) by mouth 2 (two) times daily. 06/04/17 06/14/17  Belinda Fisher, PA-C    Family History No family history on file.  Social History Social History  Substance Use Topics  . Smoking status: Current Every Day Smoker  . Smokeless tobacco: Not on file  . Alcohol use Yes     Allergies   Patient has no known allergies.   Review of Systems Review of Systems    Reason unable to perform ROS: As per HPI.     Physical Exam Triage Vital Signs ED Triage Vitals  Enc Vitals Group     BP 06/04/17 1038 128/82     Pulse Rate 06/04/17 1038 67     Resp 06/04/17 1038 16     Temp 06/04/17 1038 98 F (36.7 C)     Temp Source 06/04/17 1038 Oral     SpO2 06/04/17 1038 99 %     Weight 06/04/17 1038 155 lb (70.3 kg)     Height 06/04/17 1038 6' (1.829 m)     Head Circumference --      Peak Flow --      Pain Score 06/04/17 1100 9     Pain Loc --      Pain Edu? --      Excl. in GC? --    No data found.   Updated Vital Signs BP 128/82 (BP Location: Left Arm)   Pulse 67   Temp 98 F (36.7 C) (Oral)   Resp 16   Ht 6' (1.829 m)   Wt 155 lb (70.3 kg)   SpO2 99%   BMI 21.02 kg/m   Visual Acuity Right Eye Distance:   Left Eye Distance:   Bilateral Distance:    Right Eye Near:   Left Eye Near:    Bilateral Near:  Physical Exam  Constitutional: He is oriented to person, place, and time. He appears well-developed and well-nourished. No distress.  HENT:  Head: Normocephalic and atraumatic.  Cardiovascular: Normal rate, regular rhythm and normal heart sounds.  Exam reveals no gallop and no friction rub.   No murmur heard. Pulmonary/Chest: Effort normal and breath sounds normal. He has no wheezes. He has no rales.  Musculoskeletal:  Tenderness on palpation of the left shoulder trapezius muscles. Full range of motion of the shoulder, elbow, wrist. Strength normal and equal bilaterally. Decreased sensation on the left lateral upper arm, sensation of forearm intact and equal bilaterally. Radial pulses 2+ and equal bilaterally. Capillary refill less than 2 seconds.  Neurological: He is alert and oriented to person, place, and time.  Skin: Skin is warm and dry.     UC Treatments / Results  Labs (all labs ordered are listed, but only abnormal results are displayed) Labs Reviewed - No data to display  EKG  EKG Interpretation None        Radiology No results found.  Procedures Procedures (including critical care time)  Medications Ordered in UC Medications  methylPREDNISolone sodium succinate (SOLU-MEDROL) 125 mg/2 mL injection 125 mg (125 mg Intramuscular Given 06/04/17 1146)     Initial Impression / Assessment and Plan / UC Course  I have reviewed the triage vital signs and the nursing notes.  Pertinent labs & imaging results that were available during my care of the patient were reviewed by me and considered in my medical decision making (see chart for details).    Discussed with patient history and exam most consistent with muscle strain. and discussed with patient this could cause numbness and tingling due to inflammation, but patient' cervical DDD could also be causing symptoms. Discussed with patient will treat for inflammation, and patient to follow up with orthopedics for further evaluation. Solumedrol injection in office. Start NSAID as directed for pain and inflammation. Follow up with orthopedics for further evaluation. Resources given to patient.   Final Clinical Impressions(s) / UC Diagnoses   Final diagnoses:  Muscle strain of left shoulder region, initial encounter    New Prescriptions Discharge Medication List as of 06/04/2017 11:38 AM    START taking these medications   Details  naproxen (NAPROSYN) 500 MG tablet Take 1 tablet (500 mg total) by mouth 2 (two) times daily., Starting Thu 06/04/2017, Until Sun 06/14/2017, Normal          Dovber Ernest V, PA-C 06/04/17 1234

## 2017-06-04 NOTE — ED Triage Notes (Signed)
Complains of neck pain and pain into left upper arm.  Pain for 2 months.  Thought to have slept odd initially.  patient is right handed.

## 2017-06-04 NOTE — Discharge Instructions (Signed)
Your exam was consistent with muscle strain today. Start naproxen as directed. Discontinue goody powder/ibuprofen while on the medicine. You were given a steroid shot in office to help with the inflammation. Continue heat compress as needed. Follow up with orthopedics/PCP for further evaluation.

## 2017-07-21 ENCOUNTER — Ambulatory Visit (INDEPENDENT_AMBULATORY_CARE_PROVIDER_SITE_OTHER): Payer: Self-pay | Admitting: Orthopaedic Surgery

## 2017-07-21 ENCOUNTER — Ambulatory Visit (INDEPENDENT_AMBULATORY_CARE_PROVIDER_SITE_OTHER): Payer: Self-pay

## 2017-07-21 ENCOUNTER — Encounter (INDEPENDENT_AMBULATORY_CARE_PROVIDER_SITE_OTHER): Payer: Self-pay | Admitting: Orthopaedic Surgery

## 2017-07-21 VITALS — BP 136/92 | HR 82 | Ht 69.0 in | Wt 150.0 lb

## 2017-07-21 DIAGNOSIS — M25512 Pain in left shoulder: Secondary | ICD-10-CM

## 2017-07-21 DIAGNOSIS — M4722 Other spondylosis with radiculopathy, cervical region: Secondary | ICD-10-CM

## 2017-07-21 DIAGNOSIS — M75122 Complete rotator cuff tear or rupture of left shoulder, not specified as traumatic: Secondary | ICD-10-CM

## 2017-07-21 DIAGNOSIS — M542 Cervicalgia: Secondary | ICD-10-CM

## 2017-07-21 NOTE — Progress Notes (Signed)
Office Visit Note   [name]           Date of Birth: 1966-07-21           MRN: [id number] Visit Date: 07/21/2017              Requested by: No referring provider defined for this encounter. PCP: Default, Provider, MD   Assessment & Plan: Visit Diagnoses:  1. Neck pain   2. Complete tear of left rotator cuff   3. Other spondylosis with radiculopathy, cervical region     Plan: Patient is exam is consistent with supraspinatus tear. He's having pain in shoulders pain with activities of daily living getting dressed calming his hair reaching above his head. We will proceed with MRI scan left shoulder rule out rotator cuff tear which at this point is likely 2 months old by his history. Patient also cervical spondylosis but this point is not having significant radicular symptoms.  Follow-Up Instructions: No Follow-up on file.   Orders:  Orders Placed This Encounter  Procedures  . XR Cervical Spine 2 or 3 views   No orders of the defined types were placed in this encounter.     Procedures: No procedures performed   Clinical Data: No additional findings.   Subjective: Chief Complaint  Patient presents with  . Neck - Pain    HPI 51 year old male is seen with left shoulder pain and inability to reach his arm up over his head that been present for about 2 months. He does not recall any specific injury. He's had some Naprosyn without relief over-the-counter. He took his mother's prescription Naprosyn and noticed good relief but he still is not able to reach over his head. 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 He's noticed some discomfort in his neck. He's had some numbness radiates from his left shoulder it stops at the mid forearm region. No numbness or tingling in his hands no gait disturbance. Patient states she's had neck pain for years but just the last 2 months he can get his arm up over his head. He's used ibuprofen, Goody  powder, ice and heat without relief.  Review of Systems patient is a smoker denies shortness of breath. No GI problems. Previous hernia repair, patient had previous epidural for lumbar disc degeneration. Otherwise negative as it pertains history of present illness   Objective: Vital Signs: BP (!) 136/92   Pulse 82   Ht  (1.753 m)   Wt 150 lb (68 kg)   BMI 22.15 kg/m   Physical Exam  Constitutional: He is oriented to person, place, and time. He appears well-developed and well-nourished.  HENT:  Head: Normocephalic and atraumatic.  Eyes: Pupils are equal, round, and reactive to light. EOM are normal.  Neck: No tracheal deviation present. No thyromegaly present.  Cardiovascular: Normal rate.   Pulmonary/Chest: Effort normal. He has no wheezes.  Abdominal: Soft. Bowel sounds are normal.  Neurological: He is alert and oriented to person, place, and time.  Skin: Skin is warm and dry. Capillary refill takes less than 2 seconds.  Psychiatric: He has a normal mood and affect. His behavior is normal. Judgment and thought content normal.    Ortho Exam patient has mild  brachial plexus tenderness. Positive drop arm test on the left negative on the right. patient's right hand dominant. He no tenderness of the long head of the biceps tendon stability of  the biceps is normal  with negative Yergason test.. Good elbow flexion extension sensation of the fingers are normal. Opposite right arm shows negative drop arm test. No subscap or teres weakness right or left. Mild suprascapular fossa atrophy on the left none on the right. Opportunity reflexes are 2+ and symmetrical normal heel toe gait no lower extremity hyperreflexia.  Specialty Comments:  No specialty comments available.  Imaging: Xr Cervical Spine 2 Or 3 Views  Result Date: 07/21/2017 2 views AP and lateral cervical spine obtained and reviewed. This shows spondylosis with disc space narrowing and spurring anteriorly and posteriorly C5-6  C6-7. 2 mm anterolisthesis C7 on T1. Impression: Cervical spondylosis most significant at C5-6 C6-7    PMFS History: Patient Active Problem List   Diagnosis Date Noted  . Complete tear of left rotator cuff 07/21/2017   Past Medical History:  Diagnosis Date  . DDD (degenerative disc disease), cervical   . High cholesterol   . Scoliosis     No family history on file.  No past surgical history on file. Social History   Occupational History  . Not on file.   Social History Main Topics  . Smoking status: Current Every Day Smoker  . Smokeless tobacco: Never Used  . Alcohol use Yes  . Drug use: Yes    Types: Marijuana  . Sexual activity: Not on file

## 2017-08-02 ENCOUNTER — Ambulatory Visit
Admission: RE | Admit: 2017-08-02 | Discharge: 2017-08-02 | Disposition: A | Discharge status: Home / Self Care | Payer: 59 | Source: Ambulatory Visit | Attending: Orthopaedic Surgery | Admitting: Orthopaedic Surgery

## 2017-08-02 DIAGNOSIS — M25512 Pain in left shoulder: Secondary | ICD-10-CM

## 2017-08-11 ENCOUNTER — Ambulatory Visit (INDEPENDENT_AMBULATORY_CARE_PROVIDER_SITE_OTHER): Payer: 59 | Admitting: Orthopaedic Surgery

## 2017-08-11 ENCOUNTER — Encounter (INDEPENDENT_AMBULATORY_CARE_PROVIDER_SITE_OTHER): Payer: Self-pay | Admitting: Orthopaedic Surgery

## 2017-08-11 VITALS — BP 113/72 | HR 68 | Ht 69.0 in | Wt 150.0 lb

## 2017-08-11 DIAGNOSIS — M75122 Complete rotator cuff tear or rupture of left shoulder, not specified as traumatic: Secondary | ICD-10-CM

## 2017-08-11 NOTE — Progress Notes (Signed)
Office Visit Note   [name]           Date of Birth: [birth year]           MRN: [id number] Visit Date: 08/11/2017              Requested by: No referring provider defined for this encounter. PCP: Default, Provider, MD   Assessment & Plan: Visit Diagnoses:  1. Complete tear of left rotator cuff     Plan: Patient's had persistent symptoms with the rotator cuff tear full-thickness involving the infraspinatus which is 90% with. His teres is intact and supraspinatus shows some tendinosis. He has some mild long head of the biceps tendinosis intra-articularly and some moderate arthritis of the glenohumeral joint. He has associated small posterior labral tear and per labral cyst. 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 Patient might proceed with outpatient arthroscopy and rotator cuff repair so is able to do his job with less pain. We discussed arthroscopic evaluation and outpatient procedure if he has satisfactory interscalene block. Procedure discussed the questions were elicited and answered he understands and requests we proceed. Work slip given at patient's request stating he is being scheduled for shoulder rotator cuff repair will be about 8-10 weeks after the procedure. MRI results reviewed I [place] copy of the report.   Follow-Up Instructions: No Follow-up on file.   Orders:  No orders of the defined types were placed in this encounter.  No orders of the defined types were placed in this encounter.     Procedures: No procedures performed   Clinical Data: No additional findings.   Subjective: Chief Complaint  Patient presents with  . 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 He has pain with outstretched reaching overhead activities problems working underneath cars when he has to use his left hand  overhead. MRI scan is available this reviewed today with him. He's used ibuprofen without relief is tried activity modification. He has pain in his shoulder when he rolls over onto the left shoulder at night and wakes him up.  Review of Systems positive for one pack per day cigarette use. No GI problems. Previous hernia repair doing well. Previous epidural injections for lumbar disc degeneration. He's had some problems with the neck pain in the past. Positive for left shoulder pain   Objective: Vital Signs: BP 113/72   Pulse 68   Ht  (1.753 m)   Wt 150 lb (68 kg)   BMI 22.15 kg/m   Physical Exam  Constitutional: He is oriented to person, place, and time. He appears well-developed and well-nourished.  HENT:  Head: Normocephalic and atraumatic.  Eyes: Pupils are equal, round, and reactive to light. EOM are normal.  Neck: No tracheal deviation present. No thyromegaly present.  Cardiovascular: Normal rate.   Pulmonary/Chest: Effort normal. He has no wheezes.  Abdominal: Soft. Bowel sounds are normal.  Neurological: He is alert and oriented to person, place, and time.  Skin: Skin is warm and dry. Capillary refill takes less than 2 seconds.  Psychiatric: He has a normal mood and affect. His behavior is normal. Judgment and thought content normal.    Ortho Exam positive left arm drop test. He has some very complex tenderness no suprapubic lymphadenopathy no rash over exposed skin upper extremity reflexes are 2+ and  symmetrical. Negative Lhermitte test. No lower extremity weakness normal heel toe gait. He has weakness with the resisted external rotation left shoulder. Long head of biceps is mildly tender anteriorly elbow reaches full extension. No swelling in the upper extremity right or left.  Specialty Comments:  No specialty comments available.  Imaging:MR Shoulder Left w/o contrast (Accession [id number]) (Order [id number])  Imaging  Date: 08/02/2017 Department: [facility] AT  [address] Released By: Broadus John Authorizing: Eldred Manges, MD  Exam Information   Status Exam Begun  Exam Ended   Final [99] 08/02/2017 8:05 AM 08/02/2017 8:26 AM  PACS Images   Show images for MR Shoulder Left w/o contrast  Study Result   CLINICAL DATA:  Left arm numbness for 2 months. Unable raise the arm over the head. No known injury.  EXAM: MRI OF THE LEFT SHOULDER WITHOUT CONTRAST  TECHNIQUE: Multiplanar, multisequence MR imaging of the shoulder was performed. No intravenous contrast was administered.  COMPARISON:  None.  FINDINGS: Rotator cuff: Mild tendinosis of the supraspinatus tendon. Severe tendinosis of the infraspinatus tendon with a focal insertional interstitial tear encompassing approximately 90% of the width and measuring 6 mm in anterior- posterior dimension. Teres minor tendon is intact. Subscapularis tendon is intact.  Muscles: No atrophy or fatty replacement of nor abnormal signal within, the muscles of the rotator cuff.  Biceps long head: Mild tendinosis of the intraarticular portion of the long head of the biceps tendon.  Acromioclavicular Joint: Severe arthropathy of the acromioclavicular joint. Type use 1 acromion. No subacromial/subdeltoid bursal fluid.  Glenohumeral Joint: Partial-thickness cartilage loss of the glenohumeral joint with small inferior marginal osteophytes.  Labrum: Diffuse labral degeneration. Small posterior labral tear. Anterior inferior labral tear with a 7 mm paralabral cyst.  Bones:  No acute osseous abnormality.  No aggressive osseous lesion.  Other: No fluid collection or hematoma.  IMPRESSION: 1. Mild tendinosis of the supraspinatus tendon. 2. Severe tendinosis of the infraspinatus tendon with a focal insertional interstitial tear encompassing approximately 90% of the width and measuring 6 mm in anterior- posterior dimension. 3. Mild tendinosis of the intraarticular portion of  the long head of the biceps tendon. 4. Moderate osteoarthritis of the glenohumeral joint. 5. Diffuse labral degeneration. Small posterior labral tear. Anterior inferior labral tear with a 7 mm paralabral cyst.   Electronically Signed   By: Elige Ko   On: 08/02/2017 08:56        PMFS History: Patient Active Problem List   Diagnosis Date Noted  . Complete tear of left rotator cuff 07/21/2017   Past Medical History:  Diagnosis Date  . DDD (degenerative disc disease), cervical   . High cholesterol   . Scoliosis     No family history on file.  No past surgical history on file. Social History   Occupational History  . Not on file.   Social History Main Topics  . Smoking status: Current Every Day Smoker  . Smokeless tobacco: Never Used  . Alcohol use Yes  . Drug use: Yes    Types: Marijuana  . Sexual activity: Not on file

## 2017-09-07 DIAGNOSIS — M75122 Complete rotator cuff tear or rupture of left shoulder, not specified as traumatic: Secondary | ICD-10-CM | POA: Diagnosis not present

## 2017-09-07 DIAGNOSIS — S43432A Superior glenoid labrum lesion of left shoulder, initial encounter: Secondary | ICD-10-CM | POA: Diagnosis not present

## 2017-09-15 ENCOUNTER — Ambulatory Visit (INDEPENDENT_AMBULATORY_CARE_PROVIDER_SITE_OTHER): Payer: 59 | Admitting: Orthopaedic Surgery

## 2017-09-15 ENCOUNTER — Encounter (INDEPENDENT_AMBULATORY_CARE_PROVIDER_SITE_OTHER): Payer: Self-pay | Admitting: Orthopaedic Surgery

## 2017-09-15 VITALS — BP 116/68 | HR 60 | Ht 71.0 in | Wt 155.0 lb

## 2017-09-15 DIAGNOSIS — Z9889 Other specified postprocedural states: Secondary | ICD-10-CM

## 2017-09-15 NOTE — Progress Notes (Signed)
   Post-Op Visit Note   Patient: Derek KubaWillard D Burns           Date of Birth: 06/12/1966           MRN: [id number] Visit Date: 09/15/2017 PCP: Default, Provider, MD   Assessment & Plan: Follow-up rotator cuff repair.  He had a SLAP tear and biceps tendon was adherent in the superior portion of the bicipital groove and biceps was resected.  Rotator cuff was repaired with ultra fix anchor.  He is using some ibuprofen during the day Percocet at night.  He can start removing his sling to do some pendulum exercises  Chief Complaint:  Chief Complaint  Patient presents with  . Left Shoulder - Routine Post Op   Visit Diagnoses:  1. H/O repair of left rotator cuff     Plan: Patient can do some pendulum swings some arm cervical swings.  He can lay down in supine position use of broomstick to gradually flex using his opposite arm to help hold onto the stick for assistance.  I will recheck him in 4 weeks.  Follow-Up Instructions: No Follow-up on file.   Orders:  No orders of the defined types were placed in this encounter.  No orders of the defined types were placed in this encounter.   Imaging: No results found.  PMFS History: Patient Active Problem List   Diagnosis Date Noted  . Complete tear of left rotator cuff 07/21/2017   Past Medical History:  Diagnosis Date  . DDD (degenerative disc disease), cervical   . High cholesterol   . Scoliosis     No family history on file.  No past surgical history on file. Social History   Occupational History  . Not on file  Tobacco Use  . Smoking status: Current Every Day Smoker  . Smokeless tobacco: Never Used  Substance and Sexual Activity  . Alcohol use: Yes  . Drug use: Yes    Types: Marijuana  . Sexual activity: Not on file

## 2017-10-15 ENCOUNTER — Encounter (INDEPENDENT_AMBULATORY_CARE_PROVIDER_SITE_OTHER): Payer: Self-pay | Admitting: Orthopaedic Surgery

## 2017-10-15 ENCOUNTER — Ambulatory Visit (INDEPENDENT_AMBULATORY_CARE_PROVIDER_SITE_OTHER): Payer: 59 | Admitting: Orthopaedic Surgery

## 2017-10-15 VITALS — BP 109/79 | HR 79

## 2017-10-15 DIAGNOSIS — Z9889 Other specified postprocedural states: Secondary | ICD-10-CM

## 2017-10-15 NOTE — Progress Notes (Signed)
   Post-Op Visit Note   [name]           Date of Birth: 01/29/1966           MRN: [id number] Visit Date: 10/15/2017 PCP: Default, Provider, MD   Assessment & Plan: Shoulder rotator cuff repair.  He is able to get his arm up over his head he is progressing with strength.  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  Work slip given for work resumption on 10/28/2017.  Chief Complaint: Left rotator cuff repair. Chief Complaint  Patient presents with  . Left Wrist - Routine Post Op   Visit Diagnoses:  1. Status post left rotator cuff repair     Plan: Need to work on strengthening resume work after the new year in January.  Follow-up here will be on a as needed basis.  He is happy with the surgical result.  Follow-Up Instructions: Return if symptoms worsen or fail to improve.   Orders:  No orders of the defined types were placed in this encounter.  No orders of the defined types were placed in this encounter.   Imaging: No results found.  PMFS History: There are no active problems to display for this patient.  Past Medical History:  Diagnosis Date  . DDD (degenerative disc disease), cervical   . High cholesterol   . Scoliosis     History reviewed. No pertinent family history.  History reviewed. No pertinent surgical history. Social History   Occupational History  . Not on file  Tobacco Use  . Smoking status: Current Every Day Smoker  . Smokeless tobacco: Never Used  Substance and Sexual Activity  . Alcohol use: Yes  . Drug use: Yes    Types: Marijuana  . Sexual activity: Not on file

## 2017-12-16 ENCOUNTER — Encounter (INDEPENDENT_AMBULATORY_CARE_PROVIDER_SITE_OTHER): Payer: Self-pay | Admitting: Orthopaedic Surgery

## 2017-12-16 ENCOUNTER — Ambulatory Visit (INDEPENDENT_AMBULATORY_CARE_PROVIDER_SITE_OTHER): Payer: Commercial Managed Care - PPO | Admitting: Orthopaedic Surgery

## 2017-12-16 ENCOUNTER — Ambulatory Visit (INDEPENDENT_AMBULATORY_CARE_PROVIDER_SITE_OTHER): Payer: Commercial Managed Care - PPO

## 2017-12-16 VITALS — BP 133/89 | HR 71 | Ht 70.0 in | Wt 160.0 lb

## 2017-12-16 DIAGNOSIS — M545 Low back pain: Secondary | ICD-10-CM

## 2017-12-16 DIAGNOSIS — M47812 Spondylosis without myelopathy or radiculopathy, cervical region: Secondary | ICD-10-CM

## 2017-12-16 NOTE — Progress Notes (Signed)
Office Visit Note   [name]           Date of Birth: 11/28/1965           MRN: [id number] Visit Date: 12/16/2017              Requested by: No referring provider defined for this encounter. PCP: Default, Provider, MD   Assessment & Plan: Visit Diagnoses:  1. Acute right-sided low back pain, with sciatica presence unspecified   2. 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  He got good relief from previous epidural injection on the right L3 and L4 nerve root 4 years ago and his pain is gradually recurred over the last 6 months to the point where he states he cannot take it any longer and pain is severe.  He has had significant multilevel changes that I would recommend proceeding with an epidural and skip the cost of a new MRI scan since he essentially has at least moderate to severe narrowing at multiple levels.  He can return if he has persistent problems.  Follow-Up Instructions: No Follow-up on file.   Orders:  Orders Placed This Encounter  Procedures  . XR Lumbar Spine 2-3 Views  . Ambulatory referral to Physical Medicine Rehab   No orders of the defined types were placed in this encounter.     Procedures: No procedures performed   Clinical Data: No additional findings.   Subjective: Chief Complaint  Patient presents with  . Lower Back - Pain  . Neck - Pain    HPI 52 year old male returns with more back and neck pain issues.  Previous cervical spine x-rays reviewed on PACS which showed spondylitic changes at C5-6 with prominent posterior spurs on the endplate.  Narrowing at C5-6 and C6-7.  Patient states she has been having neck and right arm pain.  Back is been bothering him for several months starting back in December with pain that radiates down his  right leg.  X-rays obtained today shows multilevel spondylitic changes asymmetric disc space narrowing plate spurring facet degenerative changes..  Denies associated bowel or bladder.  Negative for claudication symptoms.  Pain radiates from his back into his anterolateral right thigh stops at the level of the knee.  Review of Systems 14 point review of systems updated unchanged since his rotator cuff surgery in December other than as mentioned in HPI.  Patient states his left shoulder is doing well.   Objective: Vital Signs: BP 133/89   Pulse 71   Ht 5\' 10"  (1.778 m)   Wt 160 lb (72.6 kg)   BMI 22.96 kg/m   Physical Exam  Constitutional: He is oriented to person, place, and time. He appears well-developed and well-nourished.  HENT:  Head: Normocephalic and atraumatic.  Eyes: EOM are normal. Pupils are equal, round, and reactive to light.  Neck: No tracheal deviation present. No thyromegaly present.  Cardiovascular: Normal rate.  Pulmonary/Chest: Effort normal. He has no wheezes.  Abdominal: Soft. Bowel sounds are normal.  Neurological: He is alert and oriented to person, place, and time.  Skin: Skin is warm and dry. Capillary refill takes less than 2 seconds.  Psychiatric: He has a normal mood and affect. His behavior is normal. Judgment and thought content normal.    Ortho Exam right left shoulder over his head  without pain.  Upper extremity reflexes are 2+ and symmetrical.  Positive brachial plexus tenderness on the right negative on the left.  Positive Spurling on the right.  He has some sciatic notch tenderness on the right negative on the left.  Anterior tib gastrocsoleus heel and toe walking is normal.  Pulses are palpable.  Specialty Comments:  No specialty comments available.  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  Varying levels of stenosis with moderate stenosis at L3-4 with more right than left foraminal narrowing.  He also  had narrowing at L4-5 on the right.   PMFS History: There are no active problems to display for this patient.  Past Medical History:  Diagnosis Date  . DDD (degenerative disc disease), cervical   . High cholesterol   . Scoliosis     No family history on file.  No past surgical history on file. Social History   Occupational History  . Not on file  Tobacco Use  . Smoking status: Current Every Day Smoker  . Smokeless tobacco: Never Used  Substance and Sexual Activity  . Alcohol use: Yes  . Drug use: Yes    Types: Marijuana  . Sexual activity: Not on file

## 2017-12-21 ENCOUNTER — Encounter (INDEPENDENT_AMBULATORY_CARE_PROVIDER_SITE_OTHER): Payer: Self-pay | Admitting: Orthopaedic Surgery

## 2017-12-25 ENCOUNTER — Telehealth (INDEPENDENT_AMBULATORY_CARE_PROVIDER_SITE_OTHER): Payer: Self-pay | Admitting: Orthopaedic Surgery

## 2017-12-25 NOTE — Telephone Encounter (Signed)
[facility] called and wanted to know what diagnosis code for the surgery Dr. Ophelia CharterYates did on November 12th.  The diagnosis code they are using is S46.012A and the insurance company does not like that one.  CB#[phone].  Thank you.

## 2017-12-25 NOTE — Telephone Encounter (Signed)
M75.112. Complete tear of left rotator cuff.

## 2017-12-25 NOTE — Telephone Encounter (Signed)
Can you check behind me and make sure this is the code they need?

## 2017-12-28 NOTE — Telephone Encounter (Signed)
Will you look at this and see what code we billed with? Thank you!

## 2017-12-30 ENCOUNTER — Ambulatory Visit (INDEPENDENT_AMBULATORY_CARE_PROVIDER_SITE_OTHER): Payer: Commercial Managed Care - PPO

## 2017-12-30 ENCOUNTER — Ambulatory Visit (INDEPENDENT_AMBULATORY_CARE_PROVIDER_SITE_OTHER): Payer: Commercial Managed Care - PPO | Admitting: Physical Medicine and Rehabilitation

## 2017-12-30 ENCOUNTER — Encounter (INDEPENDENT_AMBULATORY_CARE_PROVIDER_SITE_OTHER): Payer: Self-pay | Admitting: Physical Medicine and Rehabilitation

## 2017-12-30 VITALS — BP 128/95 | HR 69 | Temp 98.1°F

## 2017-12-30 DIAGNOSIS — M5416 Radiculopathy, lumbar region: Secondary | ICD-10-CM

## 2017-12-30 MED ORDER — BETAMETHASONE SOD PHOS & ACET 6 (3-3) MG/ML IJ SUSP
12.0000 mg | Freq: Once | INTRAMUSCULAR | Status: AC
  Administered 2017-12-30: 12 mg

## 2017-12-30 NOTE — Progress Notes (Signed)
 .  Numeric Pain Rating Scale and Functional Assessment Average Pain 8   In the last MONTH (on 0-10 scale) has pain interfered with the following?  1. General activity like being  able to carry out your everyday physical activities such as walking, climbing stairs, carrying groceries, or moving a chair?  Rating(8)   +Driver, -BT, -Dye Allergies.  

## 2017-12-30 NOTE — Patient Instructions (Signed)

## 2017-12-30 NOTE — Progress Notes (Signed)
Derek Burns - 52 y.o. male MRN [id number]  Date of birth: 12/10/1965  Office Visit Note: Visit Date: 12/30/2017 PCP: Default, Provider, MD Referred by: No ref. provider found  Subjective: Chief Complaint  Patient presents with  . Neck - Pain  . Right Lower Leg - Pain   HPI: Derek Burns is a 52 year old gentleman with chronic worsening severe right hip and leg pain to about the knee.  He denies any groin pain.  He had prior epidural injection on the right at L3 and L4 from a transforaminal standpoint 4 years ago with really good relief of his symptoms.  He is followed by Dr. Ophelia Burns who requests repeat right L3 and L4 transforaminal epidural steroid injection diagnostically and therapeutically.  The injection  will be diagnostic and hopefully therapeutic. The patient has failed conservative care including time, medications and activity modification.    ROS Otherwise per HPI.  Assessment & Plan: Visit Diagnoses:  1. Lumbar radiculopathy     Plan: No additional findings.   Meds & Orders:  Meds ordered this encounter  Medications  . betamethasone acetate-betamethasone sodium phosphate (CELESTONE) injection 12 mg    Orders Placed This Encounter  Procedures  . XR C-ARM NO REPORT  . Epidural Steroid injection    Follow-up: Return for Dr. Ophelia Burns.   Procedures: No procedures performed  Lumbosacral Transforaminal Epidural Steroid Injection - Sub-Pedicular Approach with Fluoroscopic Guidance  [name]      Date of Birth: 1966-10-16 MRN: [id number] PCP: Default, Provider, MD      Visit Date: 12/30/2017   Universal Protocol:    Date/Time: 12/30/2017  Consent Given By: the patient  Position: PRONE  Additional Comments: Vital signs were monitored before and after the procedure. Patient was prepped and draped in the usual sterile fashion. The correct patient, procedure, and site was verified.   Injection Procedure Details:  Procedure Site One Meds  Administered:  Meds ordered this encounter  Medications  . betamethasone acetate-betamethasone sodium phosphate (CELESTONE) injection 12 mg    Laterality: Right  Location/Site:  L3-L4 L4-L5  Needle size: 22 G  Needle type: Spinal  Needle Placement: Transforaminal  Findings:    -Comments: Excellent flow of contrast along the nerve and into the epidural space.  Procedure Details: After squaring off the end-plates to get a true AP view, the C-arm was positioned so that an oblique view of the foramen as noted above was visualized. The target area is just inferior to the "nose of the scotty dog" or sub pedicular. The soft tissues overlying this structure were infiltrated with 2-3 ml. of 1% Lidocaine without Epinephrine.  The spinal needle was inserted toward the target using a "trajectory" view along the fluoroscope beam.  Under AP and lateral visualization, the needle was advanced so it did not puncture dura and was located close the 6 O'Clock position of the pedical in AP tracterory. Biplanar projections were used to confirm position. Aspiration was confirmed to be negative for CSF and/or blood. A 1-2 ml. volume of Isovue-250 was injected and flow of contrast was noted at each level. Radiographs were obtained for documentation purposes.   After attaining the desired flow of contrast documented above, a 0.5 to 1.0 ml test dose of 0.25% Marcaine was injected into each respective transforaminal space.  The patient was observed for 90 seconds post injection.  After no sensory deficits were reported, and normal lower extremity motor function was noted,   the above injectate was administered so  that equal amounts of the injectate were placed at each foramen (level) into the transforaminal epidural space.   Additional Comments:  The patient tolerated the procedure well Dressing: Band-Aid    Post-procedure details: Patient was observed during the procedure. Post-procedure instructions were  reviewed.  Patient left the clinic in stable condition.    Clinical History: MRI LUMBAR SPINE WITHOUT CONTRAST  TECHNIQUE: Multiplanar, multisequence MR imaging of the lumbar spine was performed. No intravenous contrast was administered.  COMPARISON:  None.  FINDINGS: Last fully open disk space is labeled L5-S1. Present examination incorporates from T11-12 disc space through the lower sacrum. Conus just below the L1-2 disc space level.  Visualized paravertebral structures unremarkable.  Mild scoliosis. Congenitally narrowed canal with superimposed degenerative changes including:  T11-12: Minimal Schmorl's node deformity. Minimal bulge greater to the right.  T12-L1: Bulge with superimposed right paracentral protrusion/spur crowding the right ventral nerve root and with slight flattening of the right aspect of the cord. Mild facet joint degenerative changes.  L1-2: Facet joint degenerative changes and ligamentum flavum hypertrophy greater on left with slight impression left posterior lateral aspect of the thecal sac. Mild prominent disc degeneration endplate reactive changes. Slight retrolisthesis L1. Broad-based disc osteophyte complex with lateral extension slightly greater to left with slight encroachment upon exiting L1 nerve roots. Multifactorial mild to slightly moderate spinal stenosis and lateral recess stenosis greater on left.  L2-3: Facet joint degenerative changes and ligamentum flavum hypertrophy greater on the left. Disc degeneration with small Schmorl's node deformity. Mild retrolisthesis L2. Broad-based disc osteophyte complex with lateral extension greater to left with encroachment upon the exiting left L2 nerve root. Mild to slightly moderate bilateral lateral recess stenosis and spinal stenosis greater on the left.  L3-4: Moderate facet joint degenerative changes. Moderate bulge. Multifactorial moderate to slightly marked spinal stenosis  and lateral recess stenosis slightly greater on the right.  L4-5: Prominent right-sided and moderate left-sided facet joint degenerative changes with bony overgrowth. Disc degeneration with broad-based disc osteophyte having greater extension to the right with right foraminal narrowing and mass effect upon the exiting right L4 nerve root. Central aspect of disc with mild impression upon the ventral aspect of thecal sac. Multifactorial moderate spinal stenosis.  L5-S1: Moderate bilateral facet joint degenerative changes. Broad-based disc osteophyte with foraminal/lateral extension causing mild encroachment upon the exiting L5 nerve roots. Central aspect contributes to moderate bilateral lateral recess stenosis and mild spinal stenosis.  IMPRESSION: Congenitally narrowed spinal canal and mild scoliosis. Multilevel multifactorial various degrees of spinal stenosis, lateral recess stenosis and foraminal narrowing.  In this patient who is presenting with right-sided symptoms, most notable right-sided foraminal narrowing is at the L4-5 level.  Most notable spinal stenosis is at the L3-4 level.  Summary of pertinent findings includes:  T12-L1 bulge with superimposed right paracentral protrusion/spur crowding the right ventral nerve root and with slight flattening of the right aspect of the cord.  L1-2 broad -based disc osteophyte complex with lateral extension slightly greater to left with slight encroachment upon exiting L1 nerve roots. Multifactorial mild to slightly moderate spinal stenosis and lateral recess stenosis greater on left.  L2-3 broad -based disc osteophyte complex with lateral extension greater to left with encroachment upon the exiting left L2 nerve root. Mild to slightly moderate bilateral lateral recess stenosis and spinal stenosis greater on the left.  L3-4 multifactorial moderate to slightly marked spinal stenosis and lateral recess stenosis  slightly greater on the right.  L4-5 broad-based disc osteophyte having greater extension  to the right with right foraminal narrowing and mass effect upon the exiting right L4 nerve root. Central aspect of disc with mild impression upon the ventral aspect of thecal sac. Multifactorial moderate spinal stenosis.  L5-S1 broad -based disc osteophyte with foraminal/lateral extension causing mild encroachment upon the exiting L5 nerve roots. Central aspect contributes to moderate bilateral lateral recess stenosis and mild spinal stenosis.  Close correlation with physical examination will be necessary to determine which if any of these levels are responsible for the patient's presenting symptoms given the multiplicity of findings.   Electronically Signed   By: Bridgett Larsson M.D.   On: 04/11/2014 16:34  He reports that he has been smoking.  he has never used smokeless tobacco. No results for input(s): HGBA1C, LABURIC in the last 8760 hours.  Objective:  VS:  HT:    WT:   BMI:     BP:(!) 128/95  HR:69bpm  TEMP:98.1 F (36.7 C)(Oral)  RESP:99 % Physical Exam  Musculoskeletal:  Patient ambulates without aid.  He has some pain going from sit to stand but no pain with hip rotation.    Ortho Exam Imaging: No results found.  Past Medical/Family/Surgical/Social History: Medications & Allergies reviewed per EMR There are no active problems to display for this patient.  Past Medical History:  Diagnosis Date  . DDD (degenerative disc disease), cervical   . High cholesterol   . Scoliosis    History reviewed. No pertinent family history. History reviewed. No pertinent surgical history. Social History   Occupational History  . Not on file  Tobacco Use  . Smoking status: Current Every Day Smoker  . Smokeless tobacco: Never Used  Substance and Sexual Activity  . Alcohol use: Yes  . Drug use: Yes    Types: Marijuana  . Sexual activity: Not on file

## 2017-12-30 NOTE — Procedures (Signed)
Lumbosacral Transforaminal Epidural Steroid Injection - Sub-Pedicular Approach with Fluoroscopic Guidance  [name]      Date of Birth: 09/27/1966 MRN: [id number] PCP: Default, Provider, MD      Visit Date: 12/30/2017   Universal Protocol:    Date/Time: 12/30/2017  Consent Given By: the patient  Position: PRONE  Additional Comments: Vital signs were monitored before and after the procedure. Patient was prepped and draped in the usual sterile fashion. The correct patient, procedure, and site was verified.   Injection Procedure Details:  Procedure Site One Meds Administered:  Meds ordered this encounter  Medications  . betamethasone acetate-betamethasone sodium phosphate (CELESTONE) injection 12 mg    Laterality: Right  Location/Site:  L3-L4 L4-L5  Needle size: 22 G  Needle type: Spinal  Needle Placement: Transforaminal  Findings:    -Comments: Excellent flow of contrast along the nerve and into the epidural space.  Procedure Details: After squaring off the end-plates to get a true AP view, the C-arm was positioned so that an oblique view of the foramen as noted above was visualized. The target area is just inferior to the "nose of the scotty dog" or sub pedicular. The soft tissues overlying this structure were infiltrated with 2-3 ml. of 1% Lidocaine without Epinephrine.  The spinal needle was inserted toward the target using a "trajectory" view along the fluoroscope beam.  Under AP and lateral visualization, the needle was advanced so it did not puncture dura and was located close the 6 O'Clock position of the pedical in AP tracterory. Biplanar projections were used to confirm position. Aspiration was confirmed to be negative for CSF and/or blood. A 1-2 ml. volume of Isovue-250 was injected and flow of contrast was noted at each level. Radiographs were obtained for documentation purposes.   After attaining the desired flow of contrast documented  above, a 0.5 to 1.0 ml test dose of 0.25% Marcaine was injected into each respective transforaminal space.  The patient was observed for 90 seconds post injection.  After no sensory deficits were reported, and normal lower extremity motor function was noted,   the above injectate was administered so that equal amounts of the injectate were placed at each foramen (level) into the transforaminal epidural space.   Additional Comments:  The patient tolerated the procedure well Dressing: Band-Aid    Post-procedure details: Patient was observed during the procedure. Post-procedure instructions were reviewed.  Patient left the clinic in stable condition.

## 2018-01-12 ENCOUNTER — Telehealth (INDEPENDENT_AMBULATORY_CARE_PROVIDER_SITE_OTHER): Payer: Self-pay | Admitting: Orthopaedic Surgery

## 2018-01-12 ENCOUNTER — Encounter (INDEPENDENT_AMBULATORY_CARE_PROVIDER_SITE_OTHER): Payer: Self-pay | Admitting: Orthopaedic Surgery

## 2018-01-12 ENCOUNTER — Ambulatory Visit (INDEPENDENT_AMBULATORY_CARE_PROVIDER_SITE_OTHER): Payer: Commercial Managed Care - PPO | Admitting: Orthopaedic Surgery

## 2018-01-12 VITALS — BP 116/75 | HR 70 | Ht 70.0 in | Wt 160.0 lb

## 2018-01-12 DIAGNOSIS — Q7649 Other congenital malformations of spine, not associated with scoliosis: Secondary | ICD-10-CM | POA: Diagnosis not present

## 2018-01-12 DIAGNOSIS — M4726 Other spondylosis with radiculopathy, lumbar region: Secondary | ICD-10-CM

## 2018-01-12 NOTE — Telephone Encounter (Signed)
Patient states he's already filled out a medical record request form and would like to get his "records" from todays visit whenever they are ready. If you could give him a call back at [phone]

## 2018-01-12 NOTE — Progress Notes (Signed)
Office Visit Note   [name]           Date of Birth: 05/24/1966           MRN: [id number] Visit Date: 01/12/2018              Requested by: No referring provider defined for this encounter. PCP: Patient, No Pcp Per   Assessment & Plan: Visit Diagnoses:  1. Other spondylosis with radiculopathy, lumbar region   2. Congenital stenosis of lumbar spine     Plan: We discussed with him that reimaging of his lumbar spine will be an option he has multilevel was involved and last MRI 3 years ago option would have been a 2 level instrumented fusion with decompression.  He has at least moderate narrowing at the remaining levels and if the levels are fused he is to be at definite risk for progression at the remaining levels.  He can call if he like to have a repeat of the epidural.  Follow-Up Instructions: No Follow-up on file.   Orders:  No orders of the defined types were placed in this encounter.  No orders of the defined types were placed in this encounter.     Procedures: No procedures performed   Clinical Data: No additional findings.   Subjective: Chief Complaint  Patient presents with  . Lower Back - Pain, Follow-up    Post ESI    HPI 52 year old male returns with ongoing problems with chronic back pain.  He has pain adjacent to the right SI joint which is been a persistent problem.  Epidural at  L3- 4 on the right done 12/30/2017 gave him about 50% relief.  Lateral recess stenosis at L3-4.  Moderate to severe spinal stenosis at L3-4 and lateral recess stenosis greatest on the right, L4-5 with central stenosis moderate with bilateral foraminal stenosis.  Congenital short pedicles and spondylitic changes seen on plain radiographs multilevel with some secondary scoliosis and partially bridging osteophytes.  He has had previous rotator cuff surgery in 2008 he states his shoulder is doing a little bit better.  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  Review of Systems 14 point review of systems updated unchanged from 10/15/2017.   Objective: Vital Signs: BP 116/75   Pulse 70   Ht 5\' 10"  (1.778 m)   Wt 160 lb (72.6 kg)   BMI 22.96 kg/m   Physical Exam  Constitutional: He is oriented to person, place, and time. He appears well-developed and well-nourished.  HENT:  Head: Normocephalic and atraumatic.  Eyes: EOM are normal. Pupils are equal, round, and reactive to light.  Neck: No tracheal deviation present. No thyromegaly present.  Cardiovascular: Normal rate.  Pulmonary/Chest: Effort normal. He has no wheezes.  Abdominal: Soft. Bowel sounds are normal.  Neurological: He is alert and oriented to person, place, and time.  Skin: Skin is warm and dry. Capillary refill takes less than 2 seconds.  Psychiatric: He has a normal mood and affect. His behavior is normal. Judgment and thought content normal.    Ortho Exam patient is able to go from sitting standing he ambulates no isolated motor weakness negative straight leg raising.  Has tenderness over the right paralumbar region.  Mild scoliosis noted.  He has limitation of lateral bending and flexion consistent  with a spondylitic changes.  Specialty Comments:  No specialty comments available.  Imaging: No results found.   PMFS History: There are no active problems to display for this patient.  Past Medical History:  Diagnosis Date  . DDD (degenerative disc disease), cervical   . High cholesterol   . Scoliosis     No family history on file.  No past surgical history on file. Social History   Occupational History  . Not on file  Tobacco Use  . Smoking status: Current Every Day Smoker  . Smokeless tobacco: Never Used  Substance and Sexual Activity  . Alcohol use: Yes  . Drug use: Yes    Types: Marijuana  . Sexual activity: Not on file

## 2018-01-13 NOTE — Telephone Encounter (Signed)
IC, advised patient ready to pick up

## 2019-11-22 ENCOUNTER — Encounter (HOSPITAL_COMMUNITY): Payer: Self-pay | Admitting: Emergency Medicine

## 2019-11-22 ENCOUNTER — Ambulatory Visit (HOSPITAL_COMMUNITY)
Admission: EM | Admit: 2019-11-22 | Discharge: 2019-11-22 | Disposition: A | Discharge status: Home / Self Care | Payer: BC Managed Care – PPO | Attending: Physician Assistant | Admitting: Physician Assistant

## 2019-11-22 ENCOUNTER — Other Ambulatory Visit: Payer: Self-pay

## 2019-11-22 DIAGNOSIS — S0502XA Injury of conjunctiva and corneal abrasion without foreign body, left eye, initial encounter: Secondary | ICD-10-CM | POA: Diagnosis not present

## 2019-11-22 MED ORDER — HYPROMELLOSE (GONIOSCOPIC) 2.5 % OP SOLN: 1.0000 [drp] | OPHTHALMIC | 12 refills | Status: DC | PRN

## 2019-11-22 MED ORDER — POLYMYXIN B-TRIMETHOPRIM 10000-0.1 UNIT/ML-% OP SOLN: 1.0000 [drp] | OPHTHALMIC | 0 refills | Status: AC

## 2019-11-22 MED ORDER — FLUORESCEIN SODIUM 1 MG OP STRP
ORAL_STRIP | OPHTHALMIC | Status: AC
  Filled 2019-11-22: qty 1

## 2019-11-22 NOTE — ED Provider Notes (Signed)
[facility]    CSN: [id number] Arrival date & time: 11/22/19  1020      History   Chief Complaint Chief Complaint  Patient presents with  . Blurred Vision  . Eye Pain    HPI Derek Burns is a 54 y.o. male.   Patient reports to urgent care today for left eye pain and blurry vision. He woke this morning with blurred vision and eye pain. He does not recall injuring the eye. He does not feel there is something in the eye, however it burns. Light makes the pain worse. He does not wear contacts.   He works with metal but always wears eye protection.        Past Medical History:  Diagnosis Date  . DDD (degenerative disc disease), cervical   . High cholesterol   . Scoliosis     There are no problems to display for this patient.   History reviewed. No pertinent surgical history.     Home Medications    Prior to Admission medications   Medication Sig Start Date End Date Taking? Authorizing Provider  aspirin 325 MG tablet Take 325 mg by mouth daily.    [provider]  Aspirin-Acetaminophen (GOODYS BODY PAIN PO) Take by mouth.    [provider]  hydroxypropyl methylcellulose / hypromellose (ISOPTO TEARS / GONIOVISC) 2.5 % ophthalmic solution Place 1 drop into the left eye as needed for dry eyes. 11/22/19   Mega Kinkade, Veryl Speak, PA-C  ibuprofen (ADVIL,MOTRIN) 600 MG tablet Take 1 tablet (600 mg total) by mouth every 6 (six) hours as needed. Patient not taking: Reported on 01/12/2018 08/19/14   Ladona Mow, PA-C  oxyCODONE-acetaminophen (PERCOCET/ROXICET) 5-325 MG tablet  09/07/17   [provider]  traMADol-acetaminophen (ULTRACET) 37.5-325 MG tablet tramadol 37.5 mg-acetaminophen 325 mg tablet    [provider]  trimethoprim-polymyxin b (POLYTRIM) ophthalmic solution Place 1 drop into the left eye every 4 (four) hours for 7 days. 11/22/19 11/29/19  Aashna Matson, Veryl Speak, PA-C    Family History History reviewed. No pertinent family  history.  Social History Social History   Tobacco Use  . Smoking status: Current Every Day Smoker  . Smokeless tobacco: Never Used  Substance Use Topics  . Alcohol use: Yes  . Drug use: Yes    Types: Marijuana     Allergies   Patient has no known allergies.   Review of Systems Review of Systems  Constitutional: Negative.   Eyes: Positive for photophobia, pain, redness and visual disturbance.  Respiratory: Negative.   Cardiovascular: Negative.   Neurological: Negative.      Physical Exam Triage Vital Signs ED Triage Vitals  Enc Vitals Group     BP 11/22/19 1052 (!) 154/90     Pulse Rate 11/22/19 1052 72     Resp 11/22/19 1052 18     Temp 11/22/19 1052 98 F (36.7 C)     Temp Source 11/22/19 1052 Oral     SpO2 11/22/19 1052 99 %     Weight --      Height --      Head Circumference --      Peak Flow --      Pain Score 11/22/19 1053 4     Pain Loc --      Pain Edu? --      Excl. in GC? --    No data found.  Updated Vital Signs BP (!) 154/90 (BP Location: Right Arm)   Pulse 72  Temp 98 F (36.7 C) (Oral)   Resp 18   SpO2 99%   Visual Acuity Right Eye Distance:   Left Eye Distance:   Bilateral Distance:    Right Eye Near:   Left Eye Near:    Bilateral Near:     Physical Exam Vitals and nursing note reviewed.  Constitutional:      General: He is not in acute distress.    Appearance: Normal appearance. He is well-developed. He is not ill-appearing.  HENT:     Head: Normocephalic and atraumatic.  Eyes:     General:        Right eye: No foreign body.        Left eye: No foreign body.     Intraocular pressure: Left eye pressure is 22 mmHg. Measurements were taken using a handheld tonometer.    Extraocular Movements: Extraocular movements intact.     Left eye: Normal extraocular motion.     Conjunctiva/sclera:     Right eye: Right conjunctiva is not injected. No chemosis or hemorrhage.    Left eye: Left conjunctiva is injected. No chemosis,  exudate or hemorrhage.     Comments: Line and circle mark location of fluorescin uptake  Cardiovascular:     Rate and Rhythm: Normal rate.  Pulmonary:     Effort: Pulmonary effort is normal. No respiratory distress.  Abdominal:     Palpations: Abdomen is soft.     Tenderness: There is no abdominal tenderness.  Musculoskeletal:     Cervical back: Neck supple.  Skin:    General: Skin is warm and dry.  Neurological:     Mental Status: He is alert.      UC Treatments / Results  Labs (all labs ordered are listed, but only abnormal results are displayed) Labs Reviewed - No data to display  EKG   Radiology No results found.  Procedures Procedures (including critical care time)  Medications Ordered in UC Medications - No data to display  Initial Impression / Assessment and Plan / UC Course  I have reviewed the triage vital signs and the nursing notes.  Pertinent labs & imaging results that were available during my care of the patient were reviewed by me and considered in my medical decision making (see chart for details).     #corneal abrasion to left eye - believe he injured this in sleep. No foreign body or rust ring. polytrim drops and artificial tears. Instructed to follow up with ophthalmology in the next 1-3 days.   Final Clinical Impressions(s) / UC Diagnoses   Final diagnoses:  Abrasion of left cornea, initial encounter     Discharge Instructions     Call the opathalmology office I have provided the number for  Place 1 drop into your Left eye of the antibiotic every 4 hours while awake.  Use artificial tears as needed in between  Tylenol for pain  Always wear eye protection while working. Do not scratch your eye, use a soft cloth to wipe tears.      ED Prescriptions    Medication Sig Dispense Auth. Provider   hydroxypropyl methylcellulose / hypromellose (ISOPTO TEARS / GONIOVISC) 2.5 % ophthalmic solution Place 1 drop into the left eye as  needed for dry eyes. 15 mL Carle Fenech, Veryl Speak, PA-C   trimethoprim-polymyxin b (POLYTRIM) ophthalmic solution Place 1 drop into the left eye every 4 (four) hours for 7 days. 10 mL Malka Bocek, Veryl Speak, PA-C     PDMP not reviewed this  encounter.   Purnell Shoemaker, PA-C 11/22/19 2029

## 2019-11-22 NOTE — Discharge Instructions (Addendum)
Call the opathalmology office I have provided the number for  Place 1 drop into your Left eye of the antibiotic every 4 hours while awake.  Use artificial tears as needed in between  Tylenol for pain  Always wear eye protection while working. Do not scratch your eye, use a soft cloth to wipe tears.

## 2019-11-22 NOTE — ED Triage Notes (Signed)
Pt sts woke up with left eye pain, redness and blurred vision this am

## 2020-03-06 ENCOUNTER — Ambulatory Visit: Payer: Self-pay

## 2020-03-06 ENCOUNTER — Ambulatory Visit (INDEPENDENT_AMBULATORY_CARE_PROVIDER_SITE_OTHER): Payer: BC Managed Care – PPO | Admitting: Orthopaedic Surgery

## 2020-03-06 ENCOUNTER — Other Ambulatory Visit: Payer: Self-pay

## 2020-03-06 ENCOUNTER — Encounter: Payer: Self-pay | Admitting: Orthopaedic Surgery

## 2020-03-06 VITALS — Ht 70.0 in | Wt 160.0 lb

## 2020-03-06 DIAGNOSIS — M542 Cervicalgia: Secondary | ICD-10-CM | POA: Diagnosis not present

## 2020-03-06 DIAGNOSIS — M25512 Pain in left shoulder: Secondary | ICD-10-CM

## 2020-03-06 MED ORDER — PREDNISONE 10 MG PO TABS
ORAL_TABLET | ORAL | 0 refills | Status: DC
Start: 2020-03-06 — End: 2021-05-04

## 2020-03-06 NOTE — Progress Notes (Signed)
Office Visit Note   [name]           Date of Birth: 1965-11-12           MRN: [id number] Visit Date: 03/06/2020              Requested by: No referring provider defined for this encounter. PCP: Patient, No Pcp Per   Assessment & Plan: Visit Diagnoses:  1. Left shoulder pain, unspecified chronicity   2. Cervicalgia     Plan: We discussed the symptom is shoulder related to cervical spondylosis.  If he develops progressive weakness or significant increased pain he will call and let us know.  We will send in a prednisone 10 mg Dosepak.  Side effects discussed.  He will return if he has persistent problems in 4 weeks.  Follow-Up Instructions: Return in about 4 weeks (around 04/03/2020).   Orders:  Orders Placed This Encounter  Procedures  . XR Cervical Spine 2 or 3 views  . XR Shoulder Left   Meds ordered this encounter  Medications  . predniSONE (DELTASONE) 10 MG tablet    Sig: Take as instructed with food. 6,5,4,3,2,1 decreasing dose daily    Dispense:  21 tablet    Refill:  0      Procedures: No procedures performed   Clinical Data: No additional findings.   Subjective: Chief Complaint  Patient presents with  . Left Shoulder - Pain  . Neck - Pain    HPI 54 year old male returns post left rotator cuff repair 09/08/2027.  Recently has noted increased pain posterior cervical left side of his neck that radiates to the superior medial border of the scapula and sometimes down to the deltoid insertion site.  He has tried some therapy at home on his shoulder it does not seem to really help.  When he turns and twists his neck he points to the C7 is area where he has increased pain.  Pain sometimes radiates down to his elbow is not really noticing going all the way to his fingers.  Review of Systems positive for cervical spondylosis previous left rotator cuff repair   Objective: Vital Signs: Ht 5\' 10"  (1.778 m)   Wt 160 lb (72.6 kg)   BMI 22.96 kg/m    Physical Exam Constitutional:      Appearance: He is well-developed.  HENT:     Head: Normocephalic and atraumatic.  Eyes:     Pupils: Pupils are equal, round, and reactive to light.  Neck:     Thyroid: No thyromegaly.     Trachea: No tracheal deviation.  Cardiovascular:     Rate and Rhythm: Normal rate.  Pulmonary:     Effort: Pulmonary effort is normal.     Breath sounds: No wheezing.  Abdominal:     General: Bowel sounds are normal.     Palpations: Abdomen is soft.  Skin:    General: Skin is warm and dry.     Capillary Refill: Capillary refill takes less than 2 seconds.  Neurological:     Mental Status: He is alert and oriented to person, place, and time.  Psychiatric:        Behavior: Behavior normal.        Thought Content: Thought content normal.        Judgment: Judgment normal.     Ortho Exam patient has negative drop arm test.  He has a positive Spurling positive brachial plexus tenderness increased pain with cervical compression.  Upper  extremity reflexes are 1+ and symmetrical.  Long head of the biceps is normal.  Specialty Comments:  No specialty comments available.  Imaging: No results found.   PMFS History: There are no problems to display for this patient.  Past Medical History:  Diagnosis Date  . DDD (degenerative disc disease), cervical   . High cholesterol   . Scoliosis     No family history on file.  No past surgical history on file. Social History   Occupational History  . Not on file  Tobacco Use  . Smoking status: Current Every Day Smoker  . Smokeless tobacco: Never Used  Substance and Sexual Activity  . Alcohol use: Yes  . Drug use: Yes    Types: Marijuana  . Sexual activity: Not on file

## 2020-04-03 ENCOUNTER — Ambulatory Visit (INDEPENDENT_AMBULATORY_CARE_PROVIDER_SITE_OTHER): Payer: BC Managed Care – PPO | Admitting: Orthopaedic Surgery

## 2020-04-03 ENCOUNTER — Encounter: Payer: Self-pay | Admitting: Orthopaedic Surgery

## 2020-04-03 ENCOUNTER — Other Ambulatory Visit: Payer: Self-pay

## 2020-04-03 DIAGNOSIS — M47812 Spondylosis without myelopathy or radiculopathy, cervical region: Secondary | ICD-10-CM | POA: Diagnosis not present

## 2020-04-03 NOTE — Progress Notes (Signed)
Office Visit Note   [name]           Date of Birth: 10-17-1966           MRN: [id number] Visit Date: 04/03/2020              Requested by: No referring provider defined for this encounter. PCP: Patient, No Pcp Per   Assessment & Plan: Visit Diagnoses:  1. Spondylosis without myelopathy or radiculopathy, cervical region     Plan: Patient is currently significantly improved with prednisone Dosepak.  We will check him back in 4 months.  We reviewed his previous x-rays which showed significant spondylitic changes with disc space narrowing C5-6 C6-7 and posterior osteophytes larger at C5-6.  If he develops increased symptoms we can consider diagnostic cervical MRI scan.  We discussed length of time from original shoulder symptoms to rotator cuff repair.  He is able get his arm up over his head we discussed gradually working on strengthening down low to help with some activities when he does occasionally have to use his arm over his head at work.  Follow-Up Instructions: Return in about 4 months (around 08/03/2020).   Orders:  No orders of the defined types were placed in this encounter.  No orders of the defined types were placed in this encounter.     Procedures: No procedures performed   Clinical Data: No additional findings.   Subjective: Chief Complaint  Patient presents with  . Neck - Pain, Follow-up    HPI 54 year old male returns with problems with neck pain and decreased strength on his left arm with overhead positioning and some occasional intermittent tingling in his hand and fingers.  Patient states the prednisone pack is improved his symptoms significantly.  He is back at work and states at this point he feels like he is somewhat closer to normal.  We reviewed the previous rotator cuff surgery where he had significant infraspinatus tear with repair.  Hand does not tend to wake him up at night. Review of Systems he of systems updated.  Patient does  have lumbar stenosis moderate to severe to level L3-4 L4-5.  He has congenital short pedicles.  No current neurogenic claudication symptoms.  Otherwise noncontributory date HPI all systems reviewed.   Objective: Vital Signs: BP 129/72   Pulse 71   Ht 5\' 10"  (1.778 m)   Wt 160 lb (72.6 kg)   BMI 22.96 kg/m   Physical Exam Constitutional:      Appearance: He is well-developed.  HENT:     Head: Normocephalic and atraumatic.  Eyes:     Pupils: Pupils are equal, round, and reactive to light.  Neck:     Thyroid: No thyromegaly.     Trachea: No tracheal deviation.  Cardiovascular:     Rate and Rhythm: Normal rate.  Pulmonary:     Effort: Pulmonary effort is normal.     Breath sounds: No wheezing.  Abdominal:     General: Bowel sounds are normal.     Palpations: Abdomen is soft.  Skin:    General: Skin is warm and dry.     Capillary Refill: Capillary refill takes less than 2 seconds.  Neurological:     Mental Status: He is alert and oriented to person, place, and time.  Psychiatric:        Behavior: Behavior normal.        Thought Content: Thought content normal.        Judgment: Judgment  normal.     Ortho Exam well-healed rotator cuff repair incision anterior posterior portal some brachial plexus tenderness decreased cervical range of motion 50% positive Spurling on the left negative on the right.  Station is hand is intact no motor atrophy.  Biceps triceps brachial radialis 1+ and symmetrical. Specialty Comments:  No specialty comments available.  Imaging: No results found.   PMFS History: Patient Active Problem List   Diagnosis Date Noted  . Spondylosis without myelopathy or radiculopathy, cervical region 04/03/2020   Past Medical History:  Diagnosis Date  . DDD (degenerative disc disease), cervical   . High cholesterol   . Scoliosis     No family history on file.  No past surgical history on file. Social History   Occupational History  . Not on file   Tobacco Use  . Smoking status: Current Every Day Smoker  . Smokeless tobacco: Never Used  Substance and Sexual Activity  . Alcohol use: Yes  . Drug use: Yes    Types: Marijuana  . Sexual activity: Not on file

## 2020-08-03 ENCOUNTER — Ambulatory Visit: Payer: BC Managed Care – PPO | Admitting: Orthopaedic Surgery

## 2020-11-27 ENCOUNTER — Other Ambulatory Visit: Payer: BC Managed Care – PPO

## 2020-11-27 DIAGNOSIS — Z20822 Contact with and (suspected) exposure to covid-19: Secondary | ICD-10-CM

## 2020-11-28 LAB — SARS-COV-2, NAA 2 DAY TAT

## 2020-11-28 LAB — NOVEL CORONAVIRUS, NAA: SARS-CoV-2, NAA: NOT DETECTED

## 2021-01-15 ENCOUNTER — Telehealth: Payer: Self-pay | Admitting: Physical Medicine and Rehabilitation

## 2021-01-15 DIAGNOSIS — M542 Cervicalgia: Secondary | ICD-10-CM

## 2021-01-15 NOTE — Telephone Encounter (Signed)
Per Dr. Ophelia Charter notes they were then to get an MRI on the patient if his symptoms were not getting better.  We would need that MRI before doing any injection in the cervical spine.  His best bet or we can see if Dr. Ophelia Charter wants to get an MRI of the cervical spine.

## 2021-01-15 NOTE — Telephone Encounter (Signed)
Patient came by. Would like an appointment with Dr. Alvester Morin. His call back number is [phone]

## 2021-01-15 NOTE — Telephone Encounter (Signed)
Please advise 

## 2021-01-15 NOTE — Telephone Encounter (Signed)
Before pt is informed of MRI order. Does pt need to followup after MRI with Dr. Ophelia Charter after or can he call to schedule for the inj after MRi?

## 2021-01-15 NOTE — Telephone Encounter (Signed)
Mri ordered 

## 2021-01-15 NOTE — Telephone Encounter (Signed)
Yes OK for cervical MRI thanks

## 2021-01-15 NOTE — Telephone Encounter (Signed)
See previous message

## 2021-01-15 NOTE — Telephone Encounter (Signed)
Please advise on MRI

## 2021-01-15 NOTE — Telephone Encounter (Signed)
Pt called stating he had a back injection with Dr. Alvester Morin and would like to get a neck injection now but he is a pt of Dr. Ophelia Charter so he wasn't sure if he would need a referral or if he could just set an appt with Dr. Alvester Morin?  [phone]

## 2021-01-16 NOTE — Telephone Encounter (Signed)
I would let Dr. Ophelia Charter look at it and decide and I can look at it as well.

## 2021-01-16 NOTE — Telephone Encounter (Signed)
Please advise 

## 2021-01-16 NOTE — Telephone Encounter (Signed)
Pt called and informed and stated understanding  °

## 2021-01-31 ENCOUNTER — Other Ambulatory Visit: Payer: Self-pay

## 2021-01-31 ENCOUNTER — Ambulatory Visit
Admission: RE | Admit: 2021-01-31 | Discharge: 2021-01-31 | Disposition: A | Discharge status: Home / Self Care | Payer: BC Managed Care – PPO | Source: Ambulatory Visit | Attending: Orthopaedic Surgery | Admitting: Orthopaedic Surgery

## 2021-01-31 DIAGNOSIS — M542 Cervicalgia: Secondary | ICD-10-CM

## 2021-02-05 ENCOUNTER — Other Ambulatory Visit: Payer: Self-pay

## 2021-02-05 ENCOUNTER — Ambulatory Visit (INDEPENDENT_AMBULATORY_CARE_PROVIDER_SITE_OTHER): Payer: BC Managed Care – PPO | Admitting: Orthopaedic Surgery

## 2021-02-05 DIAGNOSIS — M4802 Spinal stenosis, cervical region: Secondary | ICD-10-CM | POA: Diagnosis not present

## 2021-02-05 NOTE — Progress Notes (Signed)
Office Visit Note   [name]           Date of Birth: December 04, 1965           MRN: [id number] Visit Date: 02/05/2021              Requested by: No referring provider defined for this encounter. PCP: Patient, No Pcp Per (Inactive)   Assessment & Plan: Visit Diagnoses:  1. Spinal stenosis of cervical region     Plan: Today we reviewed MRI scan and his progressive symptoms with cervical stenosis most severe at C5-6 with AP diameter only 5.2 mm and severe flattening of the cord.  Patient has significant stenosis above and below this level with 7.6 AP diameter at C4-5 and also C6-7.  Plan would be 3 level cervical fusion C4-5, C5-6 and C6-7 with allograft and plate, overnight stay in the hospital we discussed use of Hemovac drain overnight stay.  Postop use of a collar.  Risk of dysphagia, dysphonia.  Most common problem with 3 level fusion with pseudoarthrosis and potential for reoperation from a posterior approach.  Questions were elicited and answered he understands request to proceed.  Controlled  Follow-Up Instructions: No follow-ups on file.   Orders:  No orders of the defined types were placed in this encounter.  No orders of the defined types were placed in this encounter.     Procedures: No procedures performed   Clinical Data: No additional findings.   Subjective: Chief Complaint  Patient presents with  . Neck - Follow-up    HPI 55 year old male returns with ongoing problems with cervical spondylosis decreased strength in his left arm and hand and numbness.  Temporary improvement with prednisone for a few days.  He has been through home exercise program anti-inflammatories with persistent neck pain and left more than right numbness in the upper extremity.  MRI scan cervical spine has been obtained and is available for review.  Scan shows severe stenosis with narrowing AP diameter between 5 and 7.6 cm from C4-C7.  Patient has been working but has had  gradually increase symptoms.  Previous rotator cuff surgery.  He has short pedicles the lumbar spine has had some problems with those lumbar spine as well.  Review of Systems all other systems noncontributory to HPI.   Objective: Vital Signs: BP 134/83   Pulse 82   Ht 5\' 10"  (1.778 m)   Wt 160 lb (72.6 kg)   BMI 22.96 kg/m   Physical Exam Constitutional:      Appearance: He is well-developed.  HENT:     Head: Normocephalic and atraumatic.  Eyes:     Pupils: Pupils are equal, round, and reactive to light.  Neck:     Thyroid: No thyromegaly.     Trachea: No tracheal deviation.  Cardiovascular:     Rate and Rhythm: Normal rate.  Pulmonary:     Effort: Pulmonary effort is normal.     Breath sounds: No wheezing.  Abdominal:     General: Bowel sounds are normal.     Palpations: Abdomen is soft.  Skin:    General: Skin is warm and dry.     Capillary Refill: Capillary refill takes less than 2 seconds.  Neurological:     Mental Status: He is alert and oriented to person, place, and time.  Psychiatric:        Behavior: Behavior normal.        Thought Content: Thought content normal.  Judgment: Judgment normal.     Ortho Exam patient has negative impingement negative drop arm test.  Positive Spurling on the left negative on the right.  Upper extremity reflexes 1+ and symmetrical long head biceps nontender biceps in good position.  Positive Lhermitte.  Normal heel toe gait no lower extremity clonus no hyperreflexia.  Specialty Comments:  No specialty comments available.  Imaging: Narrative & Impression  CLINICAL DATA:  Chronic neck pain worse on the left. Weakness of both arms.  EXAM: MRI CERVICAL SPINE WITHOUT CONTRAST  TECHNIQUE: Multiplanar, multisequence MR imaging of the cervical spine was performed. No intravenous contrast was administered.  COMPARISON:  Radiography 03/06/2020  FINDINGS: Alignment: 2 mm degenerative anterolisthesis at C7-T1.  Otherwise normal alignment.  Vertebrae: No fracture or primary bone lesion.  Cord: No primary cord lesion.  See below regarding stenosis.  Posterior Fossa, vertebral arteries, paraspinal tissues: Negative  Disc levels:  Foramen magnum widely patent. Mild osteoarthritis at the C1-2 articulation but without encroachment upon the neural structures.  C2-3: Facet joint fusion on the right. No disc pathology. No stenosis.  C3-4: Spondylosis with endplate osteophytes and bulging disc material. No central canal stenosis. Bilateral foraminal stenosis could affect either C4 nerve.  C4-5: Spondylosis with endplate osteophytes and bulging of the disc more towards the left. Pronounced facet degeneration and hypertrophy on the left. Canal stenosis with AP diameter in the midline as narrow as 7.6 mm. Effacement of the subarachnoid space and some triangulation of cord. Bilateral foraminal stenosis, left more than right. Either C5 nerve could be affected, particularly the left.  C5-6: Spondylosis with endplate osteophytes and shallow disc herniation. Severe spinal stenosis with AP diameter of the canal only 5.2 mm. Effacement of the subarachnoid space and flattening of the cord. No abnormal cord T2 signal. Bilateral foraminal stenosis could compress either or both C6 nerves.  C6-7: Endplate osteophytes and broad-based herniation of disc material. Spinal stenosis with AP diameter of the canal 7.6 mm. Effacement of the subarachnoid space with slight deformity of the cord. Bilateral foraminal stenosis could affect either C7 nerve.  C7-T1: Bilateral facet arthropathy with 2 mm of anterolisthesis. Central disc herniation with upward migration of disc material behind C7. No compressive canal stenosis despite these findings. Bilateral foraminal narrowing could affect either C8 nerve.  T1-2 and T2-3 show mild non-compressive disc bulges.  IMPRESSION: 1. C3-4: Bilateral foraminal  stenosis could affect either C4 nerve. 2. C4-5: Spinal stenosis with AP diameter of the canal 7.6 mm. Pronounced facet arthropathy particularly on the left. Effacement of the subarachnoid space and slight triangulation of the cord. Bilateral foraminal stenosis could affect either or both C5 nerves, particularly the left. 3. C5-6: Severe spinal stenosis with AP diameter of the canal only 5.2 mm. Effacement of the subarachnoid space and flattening of the cord. Bilateral foraminal stenosis could compress either or both C6 nerves. No abnormal T2 signal visible within the cord, but the patient would be at risk of myelopathy based on this degree of stenosis. 4. C6-7: Spinal stenosis with AP diameter of the canal 7.6 mm. Effacement of the subarachnoid space and slight deformity of the cord. Bilateral foraminal stenosis could affect either C7 nerve. 5. C7-T1: Bilateral facet arthropathy with 2 mm of anterolisthesis. Central disc herniation with upward migration of disc material behind C7. No compressive canal stenosis despite these findings. Bilateral foraminal stenosis could affect either C8 nerve.   Electronically Signed   By: Paulina Fusi M.D.   On: 01/31/2021 11:15  PMFS History: Patient Active Problem List   Diagnosis Date Noted  . Spinal stenosis of cervical region 02/11/2021   Past Medical History:  Diagnosis Date  . DDD (degenerative disc disease), cervical   . High cholesterol   . Scoliosis     No family history on file.  No past surgical history on file. Social History   Occupational History  . Not on file  Tobacco Use  . Smoking status: Current Every Day Smoker  . Smokeless tobacco: Never Used  Substance and Sexual Activity  . Alcohol use: Yes  . Drug use: Yes    Types: Marijuana  . Sexual activity: Not on file

## 2021-02-11 DIAGNOSIS — M4802 Spinal stenosis, cervical region: Secondary | ICD-10-CM | POA: Insufficient documentation

## 2021-04-03 ENCOUNTER — Other Ambulatory Visit: Payer: Self-pay

## 2021-04-25 ENCOUNTER — Telehealth: Payer: Self-pay | Admitting: Orthopaedic Surgery

## 2021-04-25 ENCOUNTER — Ambulatory Visit (INDEPENDENT_AMBULATORY_CARE_PROVIDER_SITE_OTHER): Payer: BC Managed Care – PPO | Admitting: Surgery

## 2021-04-25 ENCOUNTER — Encounter: Payer: Self-pay | Admitting: Surgery

## 2021-04-25 VITALS — BP 144/102 | HR 84 | Ht 70.0 in | Wt 160.0 lb

## 2021-04-25 DIAGNOSIS — M47812 Spondylosis without myelopathy or radiculopathy, cervical region: Secondary | ICD-10-CM

## 2021-04-25 NOTE — Telephone Encounter (Signed)
Received $25.00 cash,medical records release form and disability paperwork from patient/Forwarding to [employer] today  

## 2021-04-26 NOTE — Pre-Procedure Instructions (Signed)
Surgical Instructions    Your procedure is scheduled on Friday July 8th.  Report to [facility] Main Entrance "A" at 05:30 A.M., then check in with the Admitting office.  Call this number if you have problems the morning of surgery:  [phone]   If you have any questions prior to your surgery date call [phone]: Open Monday-Friday 8am-4pm    Remember:  Do not eat or drink after midnight the night before your surgery     Take these medicines the morning of surgery with A SIP OF WATER   acetaminophen (TYLENOL)- If needed  cetirizine (ZYRTEC)  As of today, STOP taking any Aspirin (unless otherwise instructed by your surgeon) Aleve, Naproxen, Ibuprofen, Motrin, Advil, Goody's, BC's, all herbal medications, fish oil, and all vitamins.                     Do NOT Smoke (Tobacco/Vaping) or drink Alcohol 24 hours prior to your procedure.  If you use a CPAP at night, you may bring all equipment for your overnight stay.   Contacts, glasses, piercing's, hearing aid's, dentures or partials may not be worn into surgery, please bring cases for these belongings.    For patients admitted to the hospital, discharge time will be determined by your treatment team.   Patients discharged the day of surgery will not be allowed to drive home, and someone needs to stay with them for 24 hours.  ONLY 1 SUPPORT PERSON MAY BE PRESENT WHILE YOU ARE IN SURGERY. IF YOU ARE TO BE ADMITTED ONCE YOU ARE IN YOUR ROOM YOU WILL BE ALLOWED TWO (2) VISITORS.  Minor children may have two parents present. Special consideration for safety and communication needs will be reviewed on a case by case basis.   Special instructions:   [facility]- Preparing For Surgery  Before surgery, you can play an important role. Because skin is not sterile, your skin needs to be as free of germs as possible. You can reduce the number of germs on your skin by washing with CHG (chlorahexidine gluconate) Soap before surgery.  CHG is an  antiseptic cleaner which kills germs and bonds with the skin to continue killing germs even after washing.    Oral Hygiene is also important to reduce your risk of infection.  Remember - BRUSH YOUR TEETH THE MORNING OF SURGERY WITH YOUR REGULAR TOOTHPASTE  Please do not use if you have an allergy to CHG or antibacterial soaps. If your skin becomes reddened/irritated stop using the CHG.  Do not shave (including legs and underarms) for at least 48 hours prior to first CHG shower. It is OK to shave your face.  Please follow these instructions carefully.   Shower the NIGHT BEFORE SURGERY and the MORNING OF SURGERY  If you chose to wash your hair, wash your hair first as usual with your normal shampoo.  After you shampoo, rinse your hair and body thoroughly to remove the shampoo.  Use CHG Soap as you would any other liquid soap. You can apply CHG directly to the skin and wash gently with a scrungie or a clean washcloth.   Apply the CHG Soap to your body ONLY FROM THE NECK DOWN.  Do not use on open wounds or open sores. Avoid contact with your eyes, ears, mouth and genitals (private parts). Wash Face and genitals (private parts)  with your normal soap.   Wash thoroughly, paying special attention to the area where your surgery will be performed.  Thoroughly rinse your body with warm water from the neck down.  DO NOT shower/wash with your normal soap after using and rinsing off the CHG Soap.  Pat yourself dry with a CLEAN TOWEL.  Wear CLEAN PAJAMAS to bed the night before surgery  Place CLEAN SHEETS on your bed the night before your surgery  DO NOT SLEEP WITH PETS.   Day of Surgery: Shower with CHG soap. Do not wear jewelry, make up, nail polish, gel polish, artificial nails, or any other type of covering on natural nails including finger and toenails. If patients have artificial nails, gel coating, etc. that need to be removed by a nail salon please have this removed prior to surgery.  Surgery may need to be canceled/delayed if the surgeon/ anesthesia feels like the patient is unable to be adequately monitored. Do not wear lotions, powders, perfumes/colognes, or deodorant. Do not shave 48 hours prior to surgery.  Men may shave face and neck. Do not bring valuables to the hospital. [facility] is not responsible for any belongings or valuables. Wear Clean/Comfortable clothing the morning of surgery Remember to brush your teeth WITH YOUR REGULAR TOOTHPASTE.   Please read over the following fact sheets that you were given.

## 2021-04-26 NOTE — Progress Notes (Signed)
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  States that symptoms unchanged from previous visit.  He is wanting to proceed with C4-5, C5-6, C6-7 ANTERIOR CERVICAL DISCECTOMY FUSION, ALLOGRAFT AND PLATE as scheduled.  Today history and physical performed.  Patient states that he has not had a primary care physician in several years.  He admits to chronic urinary issues which include difficulty starting and stopping, dribbling, incomplete voiding.  States that he was told several years ago that he had an enlarged prostate but was never evaluated by urologist per his history.  Denies dysuria or hematuria.  Review of systems for cardiac pulmonary GI are unremarkable.  Denies fever chills.  Plan I stressed the patient that he needs to get established with a primary care physician.  I may have him see Dr. Junius Roads here in our office after his surgery to get established.  We will proceed with surgery as scheduled.  Preop routine labs will include CBC, c-Met, UA.  With his history of voiding issues I will also add on a PSA to rule out any potential prostate issues.  This was explained to patient.  Surgical procedure also discussed in great detail.  All questions answered.

## 2021-04-30 ENCOUNTER — Other Ambulatory Visit: Payer: Self-pay

## 2021-04-30 ENCOUNTER — Encounter (HOSPITAL_COMMUNITY): Payer: Self-pay

## 2021-04-30 ENCOUNTER — Ambulatory Visit (HOSPITAL_COMMUNITY)
Admission: RE | Admit: 2021-04-30 | Discharge: 2021-04-30 | Disposition: A | Discharge status: Home / Self Care | Payer: BC Managed Care – PPO | Source: Ambulatory Visit | Attending: Orthopaedic Surgery | Admitting: Orthopaedic Surgery

## 2021-04-30 ENCOUNTER — Encounter (HOSPITAL_COMMUNITY)
Admission: RE | Admit: 2021-04-30 | Discharge: 2021-04-30 | Disposition: A | Discharge status: Home / Self Care | Payer: BC Managed Care – PPO | Source: Ambulatory Visit | Attending: Orthopaedic Surgery | Admitting: Orthopaedic Surgery

## 2021-04-30 DIAGNOSIS — Z20822 Contact with and (suspected) exposure to covid-19: Secondary | ICD-10-CM | POA: Insufficient documentation

## 2021-04-30 DIAGNOSIS — Z01818 Encounter for other preprocedural examination: Secondary | ICD-10-CM

## 2021-04-30 HISTORY — DX: Other complications of anesthesia, initial encounter: T88.59XA

## 2021-04-30 LAB — COMPREHENSIVE METABOLIC PANEL
ALT: 60 U/L — ABNORMAL HIGH (ref 0–44)
AST: 40 U/L (ref 15–41)
Albumin: 3.9 g/dL (ref 3.5–5.0)
Alkaline Phosphatase: 65 U/L (ref 38–126)
Anion gap: 6 (ref 5–15)
BUN: 17 mg/dL (ref 6–20)
CO2: 26 mmol/L (ref 22–32)
Calcium: 9.6 mg/dL (ref 8.9–10.3)
Chloride: 109 mmol/L (ref 98–111)
Creatinine, Ser: 1.09 mg/dL (ref 0.61–1.24)
GFR, Estimated: >60 mL/min (ref >60)
Glucose, Bld: 66 mg/dL — ABNORMAL LOW (ref 70–99)
Potassium: 4.4 mmol/L (ref 3.5–5.1)
Sodium: 141 mmol/L (ref 135–145)
Total Bilirubin: 0.6 mg/dL (ref 0.3–1.2)
Total Protein: 6.5 g/dL (ref 6.5–8.1)

## 2021-04-30 LAB — CBC
HCT: 45.5 % (ref 39.0–52.0)
Hemoglobin: 15.4 g/dL (ref 13.0–17.0)
MCH: 31.6 pg (ref 26.0–34.0)
MCHC: 33.8 g/dL (ref 30.0–36.0)
MCV: 93.4 fL (ref 80.0–100.0)
Platelets: 194 10*3/uL (ref 150–400)
RBC: 4.87 MIL/uL (ref 4.22–5.81)
RDW: 13.7 % (ref 11.5–15.5)
WBC: 10 10*3/uL (ref 4.0–10.5)
nRBC: 0 % (ref 0.0–0.2)

## 2021-04-30 LAB — URINALYSIS, ROUTINE W REFLEX MICROSCOPIC
Bilirubin Urine: NEGATIVE
Glucose, UA: NEGATIVE mg/dL
Hgb urine dipstick: NEGATIVE
Ketones, ur: NEGATIVE mg/dL
Leukocytes,Ua: NEGATIVE
Nitrite: NEGATIVE
Protein, ur: NEGATIVE mg/dL
Specific Gravity, Urine: 1.018 (ref 1.005–1.030)
pH: 5 (ref 5.0–8.0)

## 2021-04-30 LAB — TYPE AND SCREEN
ABO/RH(D): B POS
Antibody Screen: NEGATIVE

## 2021-04-30 LAB — SARS CORONAVIRUS 2 (TAT 6-24 HRS): SARS Coronavirus 2: NEGATIVE

## 2021-04-30 LAB — SURGICAL PCR SCREEN
MRSA, PCR: NEGATIVE
Staphylococcus aureus: NEGATIVE

## 2021-04-30 LAB — PSA: Prostatic Specific Antigen: 2.1 ng/mL (ref 0.00–4.00)

## 2021-04-30 NOTE — Progress Notes (Signed)
PCP: denies Cardiologist: denies  EKG: 04/30/21 CXR: 04/30/21 ECHO: denies Stress Test: denies Cardiac Cath: denies  Fasting Blood Sugar- na Checks Blood Sugar__na_ times a day  OSA/CPAP: No  ASA/Blood Thinners: No  Covid test 04/30/21 at PAT  Anesthesia Review: No  Patient denies shortness of breath, fever, cough, and chest pain at PAT appointment.  Patient verbalized understanding of instructions provided today at the PAT appointment.  Patient asked to review instructions at home and day of surgery.

## 2021-04-30 NOTE — Pre-Procedure Instructions (Addendum)
Surgical Instructions    Your procedure is scheduled on Friday July 8th.  Report to [facility] Main Entrance "A" at 05:30 A.M., then check in with the Admitting office.  Call this number if you have problems the morning of surgery:  [phone]   If you have any questions prior to your surgery date call [phone]: Open Monday-Friday 8am-4pm    Remember:  Do not eat after midnight the night before your surgery  You may drink water, clear juice without pulp, carbonated beverages, black tea, black coffee, gatorade until 4:30 am morning of surgery.  Please complete your PRE-SURGERY ENSURE that was provided to you by 4:30 the morning of surgery.  Please, if able, drink it in one setting. DO NOT SIP.      Take these medicines the morning of surgery with A SIP OF WATER   acetaminophen (TYLENOL)- If needed  cetirizine (ZYRTEC)  As of today, STOP taking any Aspirin (unless otherwise instructed by your surgeon) Aleve, Naproxen, Ibuprofen, Motrin, Advil, Goody's, BC's, all herbal medications, fish oil, and all vitamins.                     Do NOT Smoke (Tobacco/Vaping) or drink Alcohol 24 hours prior to your procedure.  If you use a CPAP at night, you may bring all equipment for your overnight stay.   Contacts, glasses, piercing's, hearing aid's, dentures or partials may not be worn into surgery, please bring cases for these belongings.    For patients admitted to the hospital, discharge time will be determined by your treatment team.   Patients discharged the day of surgery will not be allowed to drive home, and someone needs to stay with them for 24 hours.  ONLY 1 SUPPORT PERSON MAY BE PRESENT WHILE YOU ARE IN SURGERY. IF YOU ARE TO BE ADMITTED ONCE YOU ARE IN YOUR ROOM YOU WILL BE ALLOWED TWO (2) VISITORS.  Minor children may have two parents present. Special consideration for safety and communication needs will be reviewed on a case by case basis.   Special instructions:   [facility]- Preparing For Surgery  Before surgery, you can play an important role. Because skin is not sterile, your skin needs to be as free of germs as possible. You can reduce the number of germs on your skin by washing with CHG (chlorahexidine gluconate) Soap before surgery.  CHG is an antiseptic cleaner which kills germs and bonds with the skin to continue killing germs even after washing.    Oral Hygiene is also important to reduce your risk of infection.  Remember - BRUSH YOUR TEETH THE MORNING OF SURGERY WITH YOUR REGULAR TOOTHPASTE  Please do not use if you have an allergy to CHG or antibacterial soaps. If your skin becomes reddened/irritated stop using the CHG.  Do not shave (including legs and underarms) for at least 48 hours prior to first CHG shower. It is OK to shave your face.  Please follow these instructions carefully.   Shower the NIGHT BEFORE SURGERY and the MORNING OF SURGERY  If you chose to wash your hair, wash your hair first as usual with your normal shampoo.  After you shampoo, rinse your hair and body thoroughly to remove the shampoo.  Use CHG Soap as you would any other liquid soap. You can apply CHG directly to the skin and wash gently with a scrungie or a clean washcloth.   Apply the CHG Soap to your body ONLY FROM THE NECK  DOWN.  Do not use on open wounds or open sores. Avoid contact with your eyes, ears, mouth and genitals (private parts). Wash Face and genitals (private parts)  with your normal soap.   Wash thoroughly, paying special attention to the area where your surgery will be performed.  Thoroughly rinse your body with warm water from the neck down.  DO NOT shower/wash with your normal soap after using and rinsing off the CHG Soap.  Pat yourself dry with a CLEAN TOWEL.  Wear CLEAN PAJAMAS to bed the night before surgery  Place CLEAN SHEETS on your bed the night before your surgery  DO NOT SLEEP WITH PETS.   Day of Surgery: Shower with CHG  soap. Do not wear jewelry, make up, nail polish, gel polish, artificial nails, or any other type of covering on natural nails including finger and toenails. If patients have artificial nails, gel coating, etc. that need to be removed by a nail salon please have this removed prior to surgery. Surgery may need to be canceled/delayed if the surgeon/ anesthesia feels like the patient is unable to be adequately monitored. Do not wear lotions, powders, perfumes/colognes, or deodorant. Do not shave 48 hours prior to surgery.  Men may shave face and neck. Do not bring valuables to the hospital. [facility] is not responsible for any belongings or valuables. Wear Clean/Comfortable clothing the morning of surgery Remember to brush your teeth WITH YOUR REGULAR TOOTHPASTE.   Please read over the following fact sheets that you were given.

## 2021-05-02 NOTE — Anesthesia Preprocedure Evaluation (Addendum)
Anesthesia Evaluation  Patient identified by MRN, date of birth, ID band Patient awake    Reviewed: Allergy & Precautions, NPO status , Patient's Chart, lab work & pertinent test results  History of Anesthesia Complications Negative for: history of anesthetic complications  Airway Mallampati: II  TM Distance: >3 FB Neck ROM: Full    Dental  (+) Edentulous Upper, Poor Dentition, Partial Lower, Dental Advisory Given   Pulmonary neg pulmonary ROS, Current Smoker,    Pulmonary exam normal        Cardiovascular Normal cardiovascular exam  HLD   Neuro/Psych    GI/Hepatic negative GI ROS, Neg liver ROS,   Endo/Other  negative endocrine ROS  Renal/GU negative Renal ROS  negative genitourinary   Musculoskeletal negative musculoskeletal ROS (+)   Abdominal   Peds  Hematology negative hematology ROS (+)   Anesthesia Other Findings   Reproductive/Obstetrics                            Anesthesia Physical Anesthesia Plan  ASA: 2  Anesthesia Plan: General   Post-op Pain Management:    Induction: Intravenous  PONV Risk Score and Plan: 1 and Ondansetron, Dexamethasone, Treatment may vary due to age or medical condition and Midazolam  Airway Management Planned: Oral ETT  Additional Equipment: None  Intra-op Plan:   Post-operative Plan: Extubation in OR  Informed Consent: I have reviewed the patients History and Physical, chart, labs and discussed the procedure including the risks, benefits and alternatives for the proposed anesthesia with the patient or authorized representative who has indicated his/her understanding and acceptance.     Dental advisory given  Plan Discussed with:   Anesthesia Plan Comments:        Anesthesia Quick Evaluation

## 2021-05-03 ENCOUNTER — Observation Stay (HOSPITAL_COMMUNITY)
Admission: RE | Admit: 2021-05-03 | Discharge: 2021-05-04 | Disposition: A | Discharge status: Home / Self Care | Payer: BC Managed Care – PPO | Attending: Orthopaedic Surgery | Admitting: Orthopaedic Surgery

## 2021-05-03 ENCOUNTER — Encounter (HOSPITAL_COMMUNITY): Payer: Self-pay | Admitting: Orthopaedic Surgery

## 2021-05-03 ENCOUNTER — Ambulatory Visit (HOSPITAL_COMMUNITY): Payer: BC Managed Care – PPO | Admitting: Anesthesiology

## 2021-05-03 ENCOUNTER — Other Ambulatory Visit: Payer: Self-pay

## 2021-05-03 ENCOUNTER — Encounter (HOSPITAL_COMMUNITY)
Admission: RE | Disposition: A | Discharge status: Home / Self Care | Payer: Self-pay | Source: Home / Self Care | Attending: Orthopaedic Surgery

## 2021-05-03 ENCOUNTER — Ambulatory Visit (HOSPITAL_COMMUNITY): Payer: BC Managed Care – PPO

## 2021-05-03 DIAGNOSIS — M5412 Radiculopathy, cervical region: Secondary | ICD-10-CM | POA: Diagnosis not present

## 2021-05-03 DIAGNOSIS — M4802 Spinal stenosis, cervical region: Secondary | ICD-10-CM | POA: Diagnosis not present

## 2021-05-03 DIAGNOSIS — F17210 Nicotine dependence, cigarettes, uncomplicated: Secondary | ICD-10-CM | POA: Diagnosis not present

## 2021-05-03 DIAGNOSIS — Z419 Encounter for procedure for purposes other than remedying health state, unspecified: Secondary | ICD-10-CM

## 2021-05-03 HISTORY — PX: ANTERIOR CERVICAL DECOMP/DISCECTOMY FUSION: SHX1161

## 2021-05-03 LAB — ABO/RH: ABO/RH(D): B POS

## 2021-05-03 SURGERY — ANTERIOR CERVICAL DECOMPRESSION/DISCECTOMY FUSION 3 LEVELS
Anesthesia: General | Site: Neck

## 2021-05-03 MED ORDER — KETAMINE HCL 50 MG/5ML IJ SOSY
PREFILLED_SYRINGE | INTRAMUSCULAR | Status: AC
  Filled 2021-05-03: qty 5

## 2021-05-03 MED ORDER — FENTANYL CITRATE (PF) 250 MCG/5ML IJ SOLN
INTRAMUSCULAR | Status: AC
  Filled 2021-05-03: qty 5

## 2021-05-03 MED ORDER — ACETAMINOPHEN 325 MG PO TABS
650.0000 mg | ORAL_TABLET | ORAL | Status: DC | PRN
  Administered 2021-05-04: 650 mg via ORAL
  Filled 2021-05-03: qty 2

## 2021-05-03 MED ORDER — SODIUM CHLORIDE 0.9% FLUSH: 3.0000 mL | INTRAVENOUS | Status: DC | PRN

## 2021-05-03 MED ORDER — FENTANYL CITRATE (PF) 250 MCG/5ML IJ SOLN
INTRAMUSCULAR | Status: DC | PRN
  Administered 2021-05-03: 50 ug via INTRAVENOUS
  Administered 2021-05-03: 100 ug via INTRAVENOUS
  Administered 2021-05-03 (×2): 50 ug via INTRAVENOUS

## 2021-05-03 MED ORDER — METHOCARBAMOL 500 MG PO TABS
500.0000 mg | ORAL_TABLET | Freq: Four times a day (QID) | ORAL | Status: DC | PRN
  Administered 2021-05-03 – 2021-05-04 (×3): 500 mg via ORAL
  Filled 2021-05-03 (×3): qty 1

## 2021-05-03 MED ORDER — PROPOFOL 10 MG/ML IV BOLUS
INTRAVENOUS | Status: AC
  Filled 2021-05-03: qty 20

## 2021-05-03 MED ORDER — SODIUM CHLORIDE 0.9 % IV SOLN: INTRAVENOUS | Status: DC

## 2021-05-03 MED ORDER — CHLORHEXIDINE GLUCONATE 0.12 % MT SOLN
OROMUCOSAL | Status: AC
  Administered 2021-05-03: 15 mL via OROMUCOSAL
  Filled 2021-05-03: qty 15

## 2021-05-03 MED ORDER — MENTHOL 3 MG MT LOZG: 1.0000 | LOZENGE | OROMUCOSAL | Status: DC | PRN

## 2021-05-03 MED ORDER — DEXAMETHASONE SODIUM PHOSPHATE 10 MG/ML IJ SOLN
INTRAMUSCULAR | Status: DC | PRN
  Administered 2021-05-03: 10 mg via INTRAVENOUS

## 2021-05-03 MED ORDER — METHOCARBAMOL 500 MG PO TABS: 500.0000 mg | ORAL_TABLET | Freq: Four times a day (QID) | ORAL | 0 refills | Status: DC | PRN

## 2021-05-03 MED ORDER — ONDANSETRON HCL 4 MG/2ML IJ SOLN: 4.0000 mg | Freq: Once | INTRAMUSCULAR | Status: DC | PRN

## 2021-05-03 MED ORDER — CHLORHEXIDINE GLUCONATE 0.12 % MT SOLN: 15.0000 mL | Freq: Once | OROMUCOSAL | Status: AC

## 2021-05-03 MED ORDER — CEFAZOLIN SODIUM-DEXTROSE 2-4 GM/100ML-% IV SOLN
INTRAVENOUS | Status: AC
  Filled 2021-05-03: qty 100

## 2021-05-03 MED ORDER — ONDANSETRON HCL 4 MG/2ML IJ SOLN: 4.0000 mg | Freq: Four times a day (QID) | INTRAMUSCULAR | Status: DC | PRN

## 2021-05-03 MED ORDER — OXYCODONE HCL 5 MG PO TABS
5.0000 mg | ORAL_TABLET | ORAL | Status: DC | PRN
  Administered 2021-05-03 – 2021-05-04 (×5): 10 mg via ORAL
  Filled 2021-05-03 (×5): qty 2

## 2021-05-03 MED ORDER — MIDAZOLAM HCL 2 MG/2ML IJ SOLN
INTRAMUSCULAR | Status: DC | PRN
  Administered 2021-05-03: 2 mg via INTRAVENOUS

## 2021-05-03 MED ORDER — LACTATED RINGERS IV SOLN: INTRAVENOUS | Status: DC

## 2021-05-03 MED ORDER — 0.9 % SODIUM CHLORIDE (POUR BTL) OPTIME
TOPICAL | Status: DC | PRN
  Administered 2021-05-03: 1000 mL

## 2021-05-03 MED ORDER — POLYETHYLENE GLYCOL 3350 17 G PO PACK
17.0000 g | PACK | Freq: Every day | ORAL | Status: DC
  Filled 2021-05-03: qty 1

## 2021-05-03 MED ORDER — KETAMINE HCL 10 MG/ML IJ SOLN
INTRAMUSCULAR | Status: DC | PRN
  Administered 2021-05-03: 10 mg via INTRAVENOUS
  Administered 2021-05-03: 30 mg via INTRAVENOUS
  Administered 2021-05-03: 10 mg via INTRAVENOUS

## 2021-05-03 MED ORDER — SODIUM CHLORIDE 0.9 % IV SOLN: 250.0000 mL | INTRAVENOUS | Status: DC

## 2021-05-03 MED ORDER — HYPROMELLOSE (GONIOSCOPIC) 2.5 % OP SOLN: 1.0000 [drp] | OPHTHALMIC | Status: DC | PRN

## 2021-05-03 MED ORDER — PHENOL 1.4 % MT LIQD: 1.0000 | OROMUCOSAL | Status: DC | PRN

## 2021-05-03 MED ORDER — ACETAMINOPHEN 650 MG RE SUPP: 650.0000 mg | RECTAL | Status: DC | PRN

## 2021-05-03 MED ORDER — DOCUSATE SODIUM 100 MG PO CAPS
100.0000 mg | ORAL_CAPSULE | Freq: Two times a day (BID) | ORAL | Status: DC
  Administered 2021-05-03 – 2021-05-04 (×2): 100 mg via ORAL
  Filled 2021-05-03 (×2): qty 1

## 2021-05-03 MED ORDER — LORATADINE 10 MG PO TABS
10.0000 mg | ORAL_TABLET | Freq: Every day | ORAL | Status: DC
  Administered 2021-05-04: 10 mg via ORAL
  Filled 2021-05-03: qty 1

## 2021-05-03 MED ORDER — OXYCODONE HCL 5 MG PO TABS: 5.0000 mg | ORAL_TABLET | Freq: Once | ORAL | Status: DC | PRN

## 2021-05-03 MED ORDER — METHOCARBAMOL 1000 MG/10ML IJ SOLN
500.0000 mg | Freq: Four times a day (QID) | INTRAVENOUS | Status: DC | PRN
  Filled 2021-05-03: qty 5

## 2021-05-03 MED ORDER — BUPIVACAINE-EPINEPHRINE 0.5% -1:200000 IJ SOLN
INTRAMUSCULAR | Status: AC
  Filled 2021-05-03: qty 1

## 2021-05-03 MED ORDER — CEFAZOLIN SODIUM-DEXTROSE 2-4 GM/100ML-% IV SOLN
2.0000 g | INTRAVENOUS | Status: AC
  Administered 2021-05-03: 2 g via INTRAVENOUS

## 2021-05-03 MED ORDER — HEMOSTATIC AGENTS (NO CHARGE) OPTIME
TOPICAL | Status: DC | PRN
  Administered 2021-05-03: 1 via TOPICAL

## 2021-05-03 MED ORDER — ROCURONIUM BROMIDE 10 MG/ML (PF) SYRINGE
PREFILLED_SYRINGE | INTRAVENOUS | Status: DC | PRN
  Administered 2021-05-03: 70 mg via INTRAVENOUS
  Administered 2021-05-03: 30 mg via INTRAVENOUS

## 2021-05-03 MED ORDER — SUGAMMADEX SODIUM 200 MG/2ML IV SOLN
INTRAVENOUS | Status: DC | PRN
  Administered 2021-05-03: 200 mg via INTRAVENOUS

## 2021-05-03 MED ORDER — ONDANSETRON HCL 4 MG/2ML IJ SOLN
INTRAMUSCULAR | Status: DC | PRN
  Administered 2021-05-03: 4 mg via INTRAVENOUS

## 2021-05-03 MED ORDER — HYDROMORPHONE HCL 1 MG/ML IJ SOLN
0.5000 mg | INTRAMUSCULAR | Status: DC | PRN
Start: 2021-05-03 — End: 2021-05-04
  Administered 2021-05-03: 0.5 mg via INTRAVENOUS
  Filled 2021-05-03: qty 0.5

## 2021-05-03 MED ORDER — SODIUM CHLORIDE 0.9% FLUSH
3.0000 mL | Freq: Two times a day (BID) | INTRAVENOUS | Status: DC
  Administered 2021-05-03: 3 mL via INTRAVENOUS

## 2021-05-03 MED ORDER — PHENYLEPHRINE HCL-NACL 10-0.9 MG/250ML-% IV SOLN
INTRAVENOUS | Status: DC | PRN
  Administered 2021-05-03: 20 ug/min via INTRAVENOUS

## 2021-05-03 MED ORDER — ORAL CARE MOUTH RINSE: 15.0000 mL | Freq: Once | OROMUCOSAL | Status: AC

## 2021-05-03 MED ORDER — ONDANSETRON HCL 4 MG PO TABS: 4.0000 mg | ORAL_TABLET | Freq: Four times a day (QID) | ORAL | Status: DC | PRN

## 2021-05-03 MED ORDER — MIDAZOLAM HCL 2 MG/2ML IJ SOLN
INTRAMUSCULAR | Status: AC
  Filled 2021-05-03: qty 2

## 2021-05-03 MED ORDER — OXYCODONE-ACETAMINOPHEN 5-325 MG PO TABS: 1.0000 | ORAL_TABLET | Freq: Four times a day (QID) | ORAL | 0 refills | Status: DC | PRN

## 2021-05-03 MED ORDER — AMISULPRIDE (ANTIEMETIC) 5 MG/2ML IV SOLN: 10.0000 mg | Freq: Once | INTRAVENOUS | Status: DC | PRN

## 2021-05-03 MED ORDER — FENTANYL CITRATE (PF) 100 MCG/2ML IJ SOLN: 25.0000 ug | INTRAMUSCULAR | Status: DC | PRN

## 2021-05-03 MED ORDER — OXYCODONE HCL 5 MG/5ML PO SOLN: 5.0000 mg | Freq: Once | ORAL | Status: DC | PRN

## 2021-05-03 MED ORDER — ACETAMINOPHEN 10 MG/ML IV SOLN
INTRAVENOUS | Status: DC | PRN
  Administered 2021-05-03: 1000 mg via INTRAVENOUS

## 2021-05-03 MED ORDER — PROPOFOL 10 MG/ML IV BOLUS
INTRAVENOUS | Status: DC | PRN
  Administered 2021-05-03: 200 mg via INTRAVENOUS

## 2021-05-03 MED ORDER — BUPIVACAINE-EPINEPHRINE 0.5% -1:200000 IJ SOLN
INTRAMUSCULAR | Status: DC | PRN
  Administered 2021-05-03: 6 mL

## 2021-05-03 MED ORDER — LIDOCAINE 2% (20 MG/ML) 5 ML SYRINGE
INTRAMUSCULAR | Status: DC | PRN
  Administered 2021-05-03: 100 mg via INTRAVENOUS

## 2021-05-03 SURGICAL SUPPLY — 54 items
BAG COUNTER SPONGE SURGICOUNT (BAG) ×2 IMPLANT
BENZOIN TINCTURE PRP APPL 2/3 (GAUZE/BANDAGES/DRESSINGS) ×2 IMPLANT
BIT DRILL SMALL W/STOP 14 (BIT) ×2 IMPLANT
BLADE CLIPPER SURG (BLADE) IMPLANT
BLADE SURG 15 STRL LF DISP TIS (BLADE) ×1 IMPLANT
BLADE SURG 15 STRL SS (BLADE) ×2
BONE CC-ACS 11X14X7 6D (Bone Implant) ×6 IMPLANT
BUR ROUND FLUTED 4 SOFT TCH (BURR) ×4 IMPLANT
CHIPS BONE CANC-ACS11X14X7 6D (Bone Implant) ×3 IMPLANT
COLLAR CERV LO CONTOUR FIRM DE (SOFTGOODS) ×2 IMPLANT
CORD BIPOLAR FORCEPS 12FT (ELECTRODE) IMPLANT
COVER SURGICAL LIGHT HANDLE (MISCELLANEOUS) ×2 IMPLANT
DRAPE C-ARM 42X72 X-RAY (DRAPES) ×2 IMPLANT
DRAPE HALF SHEET 40X57 (DRAPES) ×2 IMPLANT
DRAPE MICROSCOPE LEICA (MISCELLANEOUS) ×2 IMPLANT
DURAPREP 6ML APPLICATOR 50/CS (WOUND CARE) ×2 IMPLANT
ELECT COATED BLADE 2.86 ST (ELECTRODE) ×2 IMPLANT
ELECT REM PT RETURN 9FT ADLT (ELECTROSURGICAL) ×2
ELECTRODE REM PT RTRN 9FT ADLT (ELECTROSURGICAL) ×1 IMPLANT
EVACUATOR 1/8 PVC DRAIN (DRAIN) ×2 IMPLANT
GAUZE SPONGE 4X4 12PLY STRL (GAUZE/BANDAGES/DRESSINGS) ×2 IMPLANT
GAUZE XEROFORM 1X8 LF (GAUZE/BANDAGES/DRESSINGS) ×2 IMPLANT
GLOVE SRG 8 PF TXTR STRL LF DI (GLOVE) ×2 IMPLANT
GLOVE SURG ORTHO LTX SZ7.5 (GLOVE) ×4 IMPLANT
GLOVE SURG UNDER POLY LF SZ8 (GLOVE) ×4
GOWN STRL REUS W/ TWL LRG LVL3 (GOWN DISPOSABLE) ×1 IMPLANT
GOWN STRL REUS W/ TWL XL LVL3 (GOWN DISPOSABLE) ×1 IMPLANT
GOWN STRL REUS W/TWL 2XL LVL3 (GOWN DISPOSABLE) ×2 IMPLANT
GOWN STRL REUS W/TWL LRG LVL3 (GOWN DISPOSABLE) ×2
GOWN STRL REUS W/TWL XL LVL3 (GOWN DISPOSABLE) ×2
HALTER HD/CHIN CERV TRACTION D (MISCELLANEOUS) ×2 IMPLANT
HEMOSTAT SURGICEL 2X14 (HEMOSTASIS) IMPLANT
KIT BASIN OR (CUSTOM PROCEDURE TRAY) ×2 IMPLANT
KIT TURNOVER KIT B (KITS) ×2 IMPLANT
MANIFOLD NEPTUNE II (INSTRUMENTS) ×2 IMPLANT
NEEDLE 25GX 5/8IN NON SAFETY (NEEDLE) ×2 IMPLANT
NS IRRIG 1000ML POUR BTL (IV SOLUTION) ×2 IMPLANT
PACK ORTHO CERVICAL (CUSTOM PROCEDURE TRAY) ×2 IMPLANT
PAD ARMBOARD 7.5X6 YLW CONV (MISCELLANEOUS) ×4 IMPLANT
PATTIES SURGICAL .5 X.5 (GAUZE/BANDAGES/DRESSINGS) IMPLANT
PIN FIXATION SMALL TEMP (ORTHOPEDIC DISPOSABLE SUPPLIES) ×2 IMPLANT
PLATE ANT CERV XTEND 3 LV 51 (Plate) ×2 IMPLANT
POSITIONER HEAD DONUT 9IN (MISCELLANEOUS) ×2 IMPLANT
SCREW XTD VAR 4.2 SELF TAP (Screw) ×16 IMPLANT
STRIP CLOSURE SKIN 1/2X4 (GAUZE/BANDAGES/DRESSINGS) ×2 IMPLANT
SURGIFLO W/THROMBIN 8M KIT (HEMOSTASIS) IMPLANT
SUT BONE WAX W31G (SUTURE) ×2 IMPLANT
SUT VIC AB 3-0 X1 27 (SUTURE) ×2 IMPLANT
SUT VICRYL 4-0 PS2 18IN ABS (SUTURE) ×4 IMPLANT
SYR 30ML SLIP (SYRINGE) ×2 IMPLANT
SYR BULB EAR ULCER 3OZ GRN STR (SYRINGE) ×2 IMPLANT
TOWEL GREEN STERILE (TOWEL DISPOSABLE) ×2 IMPLANT
TOWEL GREEN STERILE FF (TOWEL DISPOSABLE) ×2 IMPLANT
WATER STERILE IRR 1000ML POUR (IV SOLUTION) ×2 IMPLANT

## 2021-05-03 NOTE — Discharge Instructions (Addendum)
Ok to shower 5 days postop. Must wear extra collar provided when showering.   Do not apply any creams or ointments to incision.  Do not remove steri-strips.    Can use 4x4 gauze and tape for dressing changes.    No aggressive activity.   No lifting, pushing, pulling.   Cervical collar must be on at all times except when showering.    Do not bend or turn neck.     No driving.

## 2021-05-03 NOTE — Anesthesia Postprocedure Evaluation (Signed)
Anesthesia Post Note  Patient: MACKIE HOLNESS  Procedure(s) Performed: C4-5, C5-6, C6-7 ANTERIOR CERVICAL DISCECTOMY FUSION, ALLOGRAFT AND PLATE (Neck)     Patient location during evaluation: PACU Anesthesia Type: General Level of consciousness: awake and alert Pain management: pain level controlled Vital Signs Assessment: post-procedure vital signs reviewed and stable Respiratory status: spontaneous breathing, nonlabored ventilation and respiratory function stable Cardiovascular status: blood pressure returned to baseline and stable Postop Assessment: no apparent nausea or vomiting Anesthetic complications: no   No notable events documented.  Last Vitals:  Vitals:   05/03/21 1137 05/03/21 1152  BP: (!) 153/95 (!) 154/97  Pulse: 78 78  Resp: 13 14  Temp:    SpO2: 94% 94%    Last Pain:  Vitals:   05/03/21 1137  TempSrc:   PainSc: 0-No pain                 Lucretia Kern

## 2021-05-03 NOTE — Evaluation (Signed)
Occupational Therapy Evaluation and Discharge Patient Details Name: Derek Burns MRN: [id number] DOB: [birth year] Today's Date: 05/03/2021    History of Present Illness Pt s/p C4-5, C5-6, C6-7 ANTERIOR CERVICAL DISCECTOMY FUSION   Clinical Impression   This 55 yo male admitted and underwent above presents to acute OT with all education completed with pt and family. We will sign off from OT.    Follow Up Recommendations  No OT follow up;Supervision - Intermittent    Equipment Recommendations  None recommended by OT       Precautions / Restrictions Precautions Precautions: Cervical Precaution Booklet Issued: Yes (comment) Required Braces or Orthoses: Cervical Brace Cervical Brace: Soft collar;At all times (extra collar to shower in) Restrictions Weight Bearing Restrictions: No      Mobility Transfers                 General transfer comment: Pt reports he has not issues with gettting up and around, had already been up walking with staff (NT reported this as well)                                               ADL either performed or assessed with clinical judgement   ADL                                         General ADL Comments: Educated on using only cups with straws to drink from so as not to tilt the head back or foreward too much; using cup to spit in when brushing teeth; covering soft collar at front with washcloth when eating, drinking, brushing teeth to keep it clean; not bending over towards floor to get dressed (said wife will have to A him since he is not flexible enough to bring his legs up to him for LBD); rolling over to then push up to sit when in bed and reversing for getting back in bed; covering 2nd soft collar with saran press n seal for showering; wear button up or v-neck shirts.     Vision Patient Visual Report: No change from baseline                  Pertinent Vitals/Pain Pain Assessment:  Faces Faces Pain Scale: Hurts a little bit Pain Location: neck Pain Descriptors / Indicators: Sore;Aching     Hand Dominance Right   Extremity/Trunk Assessment Upper Extremity Assessment Upper Extremity Assessment: Overall WFL for tasks assessed (reports he still has some numbness and tingling with occassional sharp pains on finger tips)           Communication Communication Communication: No difficulties   Cognition Arousal/Alertness: Awake/alert Behavior During Therapy: WFL for tasks assessed/performed Overall Cognitive Status: Within Functional Limits for tasks assessed                                                      Home Living Family/patient expects to be discharged to:: Private residence Living Arrangements: Spouse/significant other Available Help at Discharge: Family;Available 24 hours/day Type of Home: House  Bathroom Shower/Tub: Chief Strategy Officer: Standard     Home Equipment: Hand held shower head          Prior Functioning/Environment Level of Independence: Independent                 OT Problem List: Decreased range of motion;Pain         OT Goals(Current goals can be found in the care plan section) Acute Rehab OT Goals Patient Stated Goal: to go home tomorrow                                 AM-PAC OT "6 Clicks" Daily Activity     Outcome Measure Help from another person eating meals?: None Help from another person taking care of personal grooming?: None Help from another person toileting, which includes using toliet, bedpan, or urinal?: None Help from another person bathing (including washing, rinsing, drying)?: A Little Help from another person to put on and taking off regular upper body clothing?: A Little Help from another person to put on and taking off regular lower body clothing?: A Little 6 Click Score: 21   End of Session    Activity Tolerance: Patient  tolerated treatment well Patient left: in bed;with call bell/phone within reach;with family/visitor present  OT Visit Diagnosis: Pain Pain - part of body:  (incisional site and tape from bandages)                Time: 5916-3846 OT Time Calculation (min): 21 min Charges:  OT General Charges $OT Visit: 1 Visit OT Evaluation $OT Eval Moderate Complexity: 1 Mod Ignacia Palma, OTR/L Acute [employer] Pager [phone] Office [phone]    Evette Georges 05/03/2021, 5:00 PM

## 2021-05-03 NOTE — Interval H&P Note (Signed)
History and Physical Interval Note:  05/03/2021 7:23 AM  Derek Burns  has presented today for surgery, with the diagnosis of C4-5, C5-6, C6-7 cervical stenosis.  The various methods of treatment have been discussed with the patient and family. After consideration of risks, benefits and other options for treatment, the patient has consented to  Procedure(s): C4-5, C5-6, C6-7 ANTERIOR CERVICAL DISCECTOMY FUSION, ALLOGRAFT AND PLATE (N/A) as a surgical intervention.  The patient's history has been reviewed, patient examined, no change in status, stable for surgery.  I have reviewed the patient's chart and labs.  Questions were answered to the patient's satisfaction.     Eldred Manges

## 2021-05-03 NOTE — Op Note (Signed)
Preop diagnosis: C4-5, C5-6, C6-7 severe spinal stenosis.  Postop diagnosis: Same  Procedure: C4-5, C5-6, C6-7 3 level cervical anterior discectomy and fusion with allograft and plate.  Surgeon: Annell Greening MD  Assistant: Zonia Kief, PA-C medically necessary and present for the entire procedure  Anesthesia: Glide scope intubation, general anesthesia +6 cc Marcaine skin local at the end of the case.  EBL: Minimal.  Drains: 1 Hemovac neck.  Implants: Globus 51 mm Xtend plate.  14 mm screws x8.  7 mm MTF tissue bank grafts at all 3 levels.  Procedure: After induction of general anesthesia orotracheal ovation using the glide scope had ultra traction without weights wrist restraints for pulldown during fluoroscopic imaging and calf pumpers were applied.  Ancef prophylaxis prepping with DuraPrep and timeout procedure was completed.  Area squared with towel sterile skin marker was used starting at the midline extending to the left overlying the C5-6 level based on palpable landmarks.  Sterile Mayo stand the head Betadine Steri-Drape and thyroid sheets and drapes were applied.  Incision was made starting at the midline extending the left.  Platysma divided in line with fibers.  Extensive dissection underneath the platysma was performed proximal and distal and the omohyoid muscle was saved moving it slightly cephalad.  Prominent spur was noted and it was actually difficult to identify C5-6 disc space since there were multiple spurs some partial attempts at bridging but there is still motion at the C5-6 level.  Serum was used and had to be used to identify the midline as well and bur and bare rondure had to be used to remove multiple anterior spurs until we can actually get down to the disc space.  Cloward curettes, Karlin curettes, 1 and 2 mm Kerrisons, 4 mm fluted bur and self-retaining Cloward retractors with teeth blades right and left smooth blade cephalad and caudad roll used.  We progressed back to  the posterior cortex and there was half millimeter gap between spurs with motion and once the spurs removed there was a large amount of disc extrusion causing cord compression.  Complete decompression down the dura was performed and we went wide on both sides so that there was several millimeters on each side of the graft for an egress of fluid.  C5-6 level was dry in the epidural space and uncovertebral joints were stripped.  Trial sizer first of 6 and then a 7 mm with 7 giving nice fit.  Graft was marked anteriorly countersunk 2 mm.  Graft was inserted with CRNA pulling traction and then releasing once it had been countersunk 2 mm.  Graft was noted there was room on each side.  Cart retractor was moved cephalad and we proceeded to do the C4-5 level identical technique.  At this level there was 1 to 2 mm gap between posterior osteophytes worst osteophytes on the left than right and Kathlene November resection techniques with operative microscope was used to take down the spurs and posterior longitudinal ligament decompressing the dura and again a 7 mm graft was appropriate fit.  Final level C6-7 was none which had 7 mm canal.  The tightest level was C5-6 which only had a 5 mm canal from anterior to posterior.  There was some epidural bleeding on the left side at C6-7 and some Surgi-Flo was used to control bleeding with patties.  Epidural space was dry and again a 7 mm graft turned out to be the appropriate size.  We tried a 48 plate which was too short 51 mm  plate was selected.  Distal screw was placed and plate set just slightly to the left side of midline but set flat were all the spurs have been removed.  All screws were filled intermittently checking under C arm to make sure they are in good position and alignment.  Grafts are in good position pulldown was used for lateral imaging.  AP showed good position.  Once final AP lateral images were confirmed locking screws were tightened down x8.  Hemovac was placed within and  out technique.  Platysma closed with 0 Vicryl 4-0 Vicryl subcuticular closure.  Pleasant patient was moving all extremities.  He states he had a teeny bit of tingling and the ulnar 3 fingers of his right hand but had normal strength normal triceps.  Good lower extremity strength.  Postoperatively patient had dressing that was applied followed by soft collar.  Postop plan is patient will have his drain removed and dressing changed in the morning.  He can be discharged home with an extra soft collar that he can apply when he takes a shower.  His collar will be on at all times except when he switches to the shower collar that can be wrapped with cellophane.  He will follow-up in the office in 1 week.

## 2021-05-03 NOTE — Anesthesia Procedure Notes (Signed)
Procedure Name: Intubation Date/Time: 05/03/2021 7:42 AM Performed by: Drema Pry, CRNA Pre-anesthesia Checklist: Patient identified, Emergency Drugs available, Suction available and Patient being monitored Patient Re-evaluated:Patient Re-evaluated prior to induction Oxygen Delivery Method: Circle System Utilized Preoxygenation: Pre-oxygenation with 100% oxygen Induction Type: IV induction Ventilation: Mask ventilation without difficulty Laryngoscope Size: Robertshaw and 4 Grade View: Grade I Tube type: Oral Tube size: 7.5 mm Number of attempts: 1 Airway Equipment and Method: Stylet, Oral airway and Video-laryngoscopy Placement Confirmation: ETT inserted through vocal cords under direct vision, positive ETCO2 and breath sounds checked- equal and bilateral Secured at: 22 cm Tube secured with: Tape Dental Injury: Teeth and Oropharynx as per pre-operative assessment  Comments: 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

## 2021-05-03 NOTE — H&P (Signed)
Derek Burns is an 55 y.o. male.   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  States that symptoms unchanged from previous visit.  He is wanting to proceed with C4-5, C5-6, C6-7 ANTERIOR CERVICAL DISCECTOMY FUSION, ALLOGRAFT AND PLATE as scheduled.  Today history and physical performed.  Patient states that he has not had a primary care physician in several years.  He admits to chronic urinary issues which include difficulty starting and stopping, dribbling, incomplete voiding.  States that he was told several years ago that he had an enlarged prostate but was never evaluated by urologist per his history.  Denies dysuria or hematuria.  Review of systems for cardiac pulmonary GI are unremarkable.  Denies fever chills.    Past Medical History:  Diagnosis Date   Complication of anesthesia    hard to wake up, low resting heart rate   DDD (degenerative disc disease), cervical    High cholesterol    Scoliosis     Past Surgical History:  Procedure Laterality Date   HERNIA REPAIR     ROTATOR CUFF REPAIR Left     History reviewed. No pertinent family history. Social History:  reports that he has been smoking cigarettes. He has been smoking an average of 0.50 packs per day. He has never used smokeless tobacco. He reports current alcohol use. He reports current drug use. Drug: Marijuana.  Allergies: No Known Allergies  Medications Prior to Admission  Medication Sig Dispense Refill   acetaminophen (TYLENOL) 650 MG CR tablet Take 1,300 mg by mouth every 8 (eight) hours as needed for pain.     cetirizine (ZYRTEC) 10 MG tablet Take 10 mg by mouth daily.     Multiple Vitamin (MULTIVITAMIN WITH MINERALS) TABS tablet Take 1 tablet by mouth daily.     hydroxypropyl methylcellulose / hypromellose (ISOPTO TEARS / GONIOVISC) 2.5 %  ophthalmic solution Place 1 drop into the left eye as needed for dry eyes. (Patient not taking: No sig reported) 15 mL 12   ibuprofen (ADVIL,MOTRIN) 600 MG tablet Take 1 tablet (600 mg total) by mouth every 6 (six) hours as needed. 30 tablet 0   predniSONE (DELTASONE) 10 MG tablet Take as instructed with food. 6,5,4,3,2,1 decreasing dose daily (Patient not taking: No sig reported) 21 tablet 0    No results found for this or any previous visit (from the past 48 hour(s)). No results found.  Review of Systems  Constitutional:  Positive for activity change.  HENT: Negative.    Respiratory: Negative.    Cardiovascular: Negative.   Gastrointestinal: Negative.   Genitourinary:  Positive for difficulty urinating. Negative for dysuria and hematuria.  Musculoskeletal:  Positive for neck pain and neck stiffness.  Neurological:  Positive for numbness.  Psychiatric/Behavioral: Negative.     Blood pressure (!) 150/94, pulse 87, temperature 98.3 F (36.8 C), temperature source Oral, resp. rate 18, height $RemoveBe'5\' 10"'XhlhQXPrX$  (1.778 m), weight 76.6 kg, SpO2 96 %. Physical Exam HENT:     Head: Normocephalic.     Nose: Nose normal.  Eyes:     Extraocular Movements: Extraocular movements intact.  Cardiovascular:     Rate and Rhythm: Regular rhythm.     Heart sounds: Normal heart sounds.  Pulmonary:     Effort: Pulmonary effort is normal.  Musculoskeletal:     Cervical back: Tenderness present.  Neurological:  Mental Status: He is alert and oriented to person, place, and time.     Assessment/Plan C4-C7 HNP/stenosis   Plan I stressed the patient that he needs to get established with a primary care physician.  I may have him see Dr. Junius Roads here in our office after his surgery to get established.  We will proceed with surgery as scheduled.  Preop routine labs will include CBC, c-Met, UA.  With his history of voiding issues I will also add on a PSA to rule out any potential prostate issues.  This was  explained to patient.  Surgical procedure also discussed in great detail.  All questions answered.  Benjiman Core, PA-C 05/03/2021, 6:57 AM

## 2021-05-03 NOTE — Transfer of Care (Signed)
Immediate Anesthesia Transfer of Care Note  [name]  Procedure(s) Performed: C4-5, C5-6, C6-7 ANTERIOR CERVICAL DISCECTOMY FUSION, ALLOGRAFT AND PLATE (Neck)  Patient Location: PACU  Anesthesia Type:General  Level of Consciousness: drowsy, patient cooperative and responds to stimulation  Airway & Oxygen Therapy: Patient Spontanous Breathing  Post-op Assessment: Report given to RN and Post -op Vital signs reviewed and stable  Post vital signs: Reviewed and stable  Last Vitals:  Vitals Value Taken Time  BP 159/102 05/03/21 1107  Temp    Pulse 85 05/03/21 1110  Resp 11 05/03/21 1110  SpO2 98 % 05/03/21 1110  Vitals shown include unvalidated device data.  Last Pain:  Vitals:   05/03/21 0606  TempSrc:   PainSc: 0-No pain         Complications: No notable events documented.

## 2021-05-04 DIAGNOSIS — M4802 Spinal stenosis, cervical region: Secondary | ICD-10-CM | POA: Diagnosis not present

## 2021-05-04 NOTE — Progress Notes (Signed)
Patient is discharged from room 3C07 at this time. Alert and in stable condition. IV site d/c'd and instructions read to patient and spouse with understanding verbalized and all questions answered. Left unit via wheelchair with all belongings at side. 

## 2021-05-04 NOTE — Progress Notes (Signed)
     Subjective: 1 Day Post-Op Procedure(s) (LRB): C4-5, C5-6, C6-7 ANTERIOR CERVICAL DISCECTOMY FUSION, ALLOGRAFT AND PLATE (N/A) Awake, alert and oriented X4. Patient reports pain as moderate.    Objective:   VITALS:  Temp:  [97.8 F (36.6 C)-98.6 F (37 C)] 97.8 F (36.6 C) (07/09 0646) Pulse Rate:  [56-80] 56 (07/09 0646) Resp:  [12-20] 18 (07/09 0646) BP: (129-161)/(83-97) 130/83 (07/09 0646) SpO2:  [94 %-99 %] 98 % (07/09 0646)  Neurologically intact ABD soft Neurovascular intact Sensation intact distally Intact pulses distally Dorsiflexion/Plantar flexion intact   LABS No results for input(s): HGB, WBC, PLT in the last 72 hours. No results for input(s): NA, K, CL, CO2, BUN, CREATININE, GLUCOSE in the last 72 hours. No results for input(s): LABPT, INR in the last 72 hours.   Assessment/Plan: 1 Day Post-Op Procedure(s) (LRB): C4-5, C5-6, C6-7 ANTERIOR CERVICAL DISCECTOMY FUSION, ALLOGRAFT AND PLATE (N/A)  Advance diet Up with therapy Discharge home with home health  Derek Burns 05/04/2021, 11:11 AM Patient ID: Derek Burns, male   DOB: 1966/06/19, 55 y.o.   MRN: [id number]

## 2021-05-04 NOTE — Progress Notes (Signed)
Orthopedic Tech Progress Note Patient Details:  Derek Burns [birth year] [id number]  RN called requesting an Extra SOFT COLLAR for patient  Ortho Devices Type of Ortho Device: Soft collar Ortho Device/Splint Location: NECK Ortho Device/Splint Interventions: Other (comment)   Post Interventions Patient Tolerated: Well Instructions Provided: Care of device  Donald Pore 05/04/2021, 1:00 PM

## 2021-05-06 ENCOUNTER — Encounter (HOSPITAL_COMMUNITY): Payer: Self-pay | Admitting: Orthopaedic Surgery

## 2021-05-07 NOTE — Discharge Summary (Signed)
Patient ID: Derek Burns MRN: [id number] DOB/AGE: 01/26/1966 55 y.o.  Admit date: 05/03/2021 Discharge date: 05/04/2021  Admission Diagnoses:  Active Problems:   Cervical spinal stenosis   Discharge Diagnoses:  Active Problems:   Cervical spinal stenosis  status post Procedure(s): C4-5, C5-6, C6-7 ANTERIOR CERVICAL DISCECTOMY FUSION, ALLOGRAFT AND PLATE  Past Medical History:  Diagnosis Date   Complication of anesthesia    hard to wake up, low resting heart rate   DDD (degenerative disc disease), cervical    High cholesterol    Scoliosis     Surgeries: Procedure(s): C4-5, C5-6, C6-7 ANTERIOR CERVICAL DISCECTOMY FUSION, ALLOGRAFT AND PLATE on 04/04/4853   Consultants:   Discharged Condition: Improved  Hospital Course: Derek Burns is an 55 y.o. male who was admitted 05/03/2021 for operative treatment of cervical stenosis/HNP. Patient failed conservative treatments (please see the history and physical for the specifics) and had severe unremitting pain that affects sleep, daily activities and work/hobbies. After pre-op clearance, the patient was taken to the operating room on 05/03/2021 and underwent  Procedure(s): C4-5, C5-6, C6-7 ANTERIOR CERVICAL DISCECTOMY FUSION, ALLOGRAFT AND PLATE.    Patient was given perioperative antibiotics:  Anti-infectives (From admission, onward)    Start     Dose/Rate Route Frequency Ordered Stop   05/03/21 0600  ceFAZolin (ANCEF) IVPB 2g/100 mL premix        2 g 200 mL/hr over 30 Minutes Intravenous On call to O.R. 05/03/21 0554 05/03/21 0745   05/03/21 0556  ceFAZolin (ANCEF) 2-4 GM/100ML-% IVPB       Note to Pharmacy: Randa Ngo   : cabinet override      05/03/21 0556 05/03/21 0800        Patient was given sequential compression devices and early ambulation to prevent DVT.   Patient benefited maximally from hospital stay and there were no complications. At the time of discharge, the patient was urinating/moving their  bowels without difficulty, tolerating a regular diet, pain is controlled with oral pain medications and they have been cleared by PT/OT.   Recent vital signs: No data found.   Recent laboratory studies: No results for input(s): WBC, HGB, HCT, PLT, NA, K, CL, CO2, BUN, CREATININE, GLUCOSE, INR, CALCIUM in the last 72 hours.  Invalid input(s): PT, 2   Discharge Medications:   Allergies as of 05/04/2021   No Known Allergies      Medication List     STOP taking these medications    acetaminophen 650 MG CR tablet Commonly known as: TYLENOL   hydroxypropyl methylcellulose / hypromellose 2.5 % ophthalmic solution Commonly known as: ISOPTO TEARS / GONIOVISC   ibuprofen 600 MG tablet Commonly known as: ADVIL   predniSONE 10 MG tablet Commonly known as: DELTASONE       TAKE these medications    cetirizine 10 MG tablet Commonly known as: ZYRTEC Take 10 mg by mouth daily.   methocarbamol 500 MG tablet Commonly known as: Robaxin Take 1 tablet (500 mg total) by mouth every 6 (six) hours as needed for muscle spasms.   multivitamin with minerals Tabs tablet Take 1 tablet by mouth daily.   oxyCODONE-acetaminophen 5-325 MG tablet Commonly known as: PERCOCET/ROXICET Take 1-2 tablets by mouth every 6 (six) hours as needed for severe pain.        Diagnostic Studies: DG Chest 2 View  Result Date: 04/30/2021 CLINICAL DATA:  Preop for neck surgery. EXAM: CHEST - 2 VIEW COMPARISON:  Feb 25, 2007. FINDINGS: The heart size  and mediastinal contours are within normal limits. Both lungs are clear. The visualized skeletal structures are unremarkable. IMPRESSION: No active cardiopulmonary disease. Electronically Signed   By: Lupita Raider M.D.   On: 04/30/2021 14:12   DG Cervical Spine 2 or 3 views  Result Date: 05/03/2021 CLINICAL DATA:  Surgery, elective. Additional history provided: Surgery, C4-7 ACDF. Provided fluoroscopy time 26 seconds (4.0279 mGy). EXAM: CERVICAL SPINE - 2-3 VIEW;  DG C-ARM 1-60 MIN COMPARISON:  Cervical spine MRI 01/31/2021. FINDINGS: PA and lateral view intraoperative fluoroscopic images of the cervical spine are submitted, 3 images total. On the provided images, ACDF hardware spans the C4-C7 levels (ventral plate and screws as well as interbody devices). An ET tube terminates within the upper thoracic trachea. IMPRESSION: Three intraoperative fluoroscopic images of the cervical spine from C4-C7 ACDF, as described. Electronically Signed   By: Jackey Loge DO   On: 05/03/2021 11:43   DG C-Arm 1-60 Min  Result Date: 05/03/2021 CLINICAL DATA:  Surgery, elective. Additional history provided: Surgery, C4-7 ACDF. Provided fluoroscopy time 26 seconds (4.0279 mGy). EXAM: CERVICAL SPINE - 2-3 VIEW; DG C-ARM 1-60 MIN COMPARISON:  Cervical spine MRI 01/31/2021. FINDINGS: PA and lateral view intraoperative fluoroscopic images of the cervical spine are submitted, 3 images total. On the provided images, ACDF hardware spans the C4-C7 levels (ventral plate and screws as well as interbody devices). An ET tube terminates within the upper thoracic trachea. IMPRESSION: Three intraoperative fluoroscopic images of the cervical spine from C4-C7 ACDF, as described. Electronically Signed   By: Jackey Loge DO   On: 05/03/2021 11:43    Discharge Instructions     Call MD / Call 911   Complete by: As directed    If you experience chest pain or shortness of breath, CALL 911 and be transported to the hospital emergency room.  If you develope a fever above 101 F, pus (white drainage) or increased drainage or redness at the wound, or calf pain, call your surgeon's office.   Constipation Prevention   Complete by: As directed    Drink plenty of fluids.  Prune juice may be helpful.  You may use a stool softener, such as Colace (over the counter) 100 mg twice a day.  Use MiraLax (over the counter) for constipation as needed.   Diet - low sodium heart healthy   Complete by: As directed     Discharge instructions   Complete by: As directed    Ok to shower 5 days postop. Must wear extra collar provided when showering.   Do not apply any creams or ointments to incision.  Do not remove steri-strips.    Can use 4x4 gauze and tape for dressing changes.    No aggressive activity.   No lifting, pushing, pulling.   Cervical collar must be on at all times except when showering.    Do not bend or turn neck.     No driving.   Incentive spirometry RT   Complete by: As directed    Increase activity slowly as tolerated   Complete by: As directed    Post-operative opioid taper instructions:   Complete by: As directed    POST-OPERATIVE OPIOID TAPER INSTRUCTIONS: It is important to wean off of your opioid medication as soon as possible. If you do not need pain medication after your surgery it is ok to stop day one. Opioids include: Codeine, Hydrocodone(Norco, Vicodin), Oxycodone(Percocet, oxycontin) and hydromorphone amongst others.  Long term and even short term use  of opiods can cause: Increased pain response Dependence Constipation Depression Respiratory depression And more.  Withdrawal symptoms can include Flu like symptoms Nausea, vomiting And more Techniques to manage these symptoms Hydrate well Eat regular healthy meals Stay active Use relaxation techniques(deep breathing, meditating, yoga) Do Not substitute Alcohol to help with tapering If you have been on opioids for less than two weeks and do not have pain than it is ok to stop all together.  Plan to wean off of opioids This plan should start within one week post op of your joint replacement. Maintain the same interval or time between taking each dose and first decrease the dose.  Cut the total daily intake of opioids by one tablet each day Next start to increase the time between doses. The last dose that should be eliminated is the evening dose.           Follow-up Information     Eldred Manges, MD.  Schedule an appointment as soon as possible for a visit today.   Specialty: Orthopedic Surgery Why: need return office visit one week postop.  call to schedule appointment Contact information: [address] [place] [address] [phone]                 Discharge Plan:  discharge to home  Disposition:     Signed: Zonia Kief  05/07/2021, 4:29 PM

## 2021-05-10 ENCOUNTER — Other Ambulatory Visit: Payer: Self-pay

## 2021-05-10 ENCOUNTER — Ambulatory Visit (INDEPENDENT_AMBULATORY_CARE_PROVIDER_SITE_OTHER): Payer: BC Managed Care – PPO | Admitting: Orthopaedic Surgery

## 2021-05-10 ENCOUNTER — Ambulatory Visit: Payer: BC Managed Care – PPO

## 2021-05-10 VITALS — BP 141/92 | HR 89 | Ht 70.0 in | Wt 168.8 lb

## 2021-05-10 DIAGNOSIS — M542 Cervicalgia: Secondary | ICD-10-CM

## 2021-05-10 DIAGNOSIS — Z981 Arthrodesis status: Secondary | ICD-10-CM | POA: Insufficient documentation

## 2021-05-10 NOTE — Progress Notes (Signed)
   Post-Op Visit Note   [name]           Date of Birth: 10-06-1966           MRN: [id number] Visit Date: 05/10/2021 PCP: Patient, No Pcp Per (Inactive)   Assessment & Plan: Post 3 level cervical fusion anteriorly C4-C7.  X-rays look good Steri-Strips changed in 6 months good continue collar return 5 weeks for lateral cervical spine flexion extension lateral images.  He is happy the surgical result noted relief of the arm numbness tingling and weakness.  Chief Complaint:  Chief Complaint  Patient presents with   Neck - Routine Post Op   Visit Diagnoses:  1. Cervicalgia     Plan: ROV 5 wks  Follow-Up Instructions: Return in about 5 weeks (around 06/14/2021).   Orders:  Orders Placed This Encounter  Procedures   XR Cervical Spine 2 or 3 views   No orders of the defined types were placed in this encounter.   Imaging: No results found.  PMFS History: Patient Active Problem List   Diagnosis Date Noted   Cervical spinal stenosis 05/03/2021   Spinal stenosis of cervical region 02/11/2021   Past Medical History:  Diagnosis Date   Complication of anesthesia    hard to wake up, low resting heart rate   DDD (degenerative disc disease), cervical    High cholesterol    Scoliosis     No family history on file.  Past Surgical History:  Procedure Laterality Date   ANTERIOR CERVICAL DECOMP/DISCECTOMY FUSION N/A 05/03/2021   Procedure: C4-5, C5-6, C6-7 ANTERIOR CERVICAL DISCECTOMY FUSION, ALLOGRAFT AND PLATE;  Surgeon: Eldred Manges, MD;  Location: [facility] OR;  Service: Orthopedics;  Laterality: N/A;   HERNIA REPAIR     ROTATOR CUFF REPAIR Left    Social History   Occupational History   Not on file  Tobacco Use   Smoking status: Every Day    Packs/day: 0.50    Types: Cigarettes   Smokeless tobacco: Never  Vaping Use   Vaping Use: Never used  Substance and Sexual Activity   Alcohol use: Yes    Comment: socially   Drug use: Yes    Types: Marijuana    Sexual activity: Not on file

## 2021-06-14 ENCOUNTER — Encounter: Payer: Self-pay | Admitting: Orthopaedic Surgery

## 2021-06-14 ENCOUNTER — Other Ambulatory Visit: Payer: Self-pay

## 2021-06-14 ENCOUNTER — Ambulatory Visit (INDEPENDENT_AMBULATORY_CARE_PROVIDER_SITE_OTHER): Payer: BC Managed Care – PPO | Admitting: Orthopaedic Surgery

## 2021-06-14 ENCOUNTER — Ambulatory Visit: Payer: Self-pay

## 2021-06-14 VITALS — BP 128/79 | HR 90 | Ht 70.0 in | Wt 168.0 lb

## 2021-06-14 DIAGNOSIS — Z981 Arthrodesis status: Secondary | ICD-10-CM

## 2021-06-14 NOTE — Progress Notes (Signed)
   Post-Op Visit Note   [name]           Date of Birth: [birth year]           MRN: [id number] Visit Date: 06/14/2021 PCP: Patient, No Pcp Per (Inactive)   Assessment & Plan: Patient returns she is in his collar states she feels good no numbness or tingling.  No gait problems.  He has been compliant wearing the collar.  Chief Complaint:  Chief Complaint  Patient presents with   Neck - Follow-up    05/03/2021 C4-5,C5-6,C6-7 ACDF   Visit Diagnoses:  1. S/P cervical spinal fusion     Plan: Continue collar and return in 6 weeks for single lateral cervical spine x-ray.  mL graft appears to be healing slower and he has no problems continue wearing the collar for another month.  Follow-Up Instructions: Return in about 6 weeks (around 07/26/2021).   Orders:  Orders Placed This Encounter  Procedures   XR Cervical Spine 2 or 3 views   No orders of the defined types were placed in this encounter.   Imaging: No results found.  PMFS History: Patient Active Problem List   Diagnosis Date Noted   S/P cervical spinal fusion 05/10/2021   Cervical spinal stenosis 05/03/2021   Spinal stenosis of cervical region 02/11/2021   Past Medical History:  Diagnosis Date   Complication of anesthesia    hard to wake up, low resting heart rate   DDD (degenerative disc disease), cervical    High cholesterol    Scoliosis     No family history on file.  Past Surgical History:  Procedure Laterality Date   ANTERIOR CERVICAL DECOMP/DISCECTOMY FUSION N/A 05/03/2021   Procedure: C4-5, C5-6, C6-7 ANTERIOR CERVICAL DISCECTOMY FUSION, ALLOGRAFT AND PLATE;  Surgeon: Eldred Manges, MD;  Location: [facility] OR;  Service: Orthopedics;  Laterality: N/A;   HERNIA REPAIR     ROTATOR CUFF REPAIR Left    Social History   Occupational History   Not on file  Tobacco Use   Smoking status: Every Day    Packs/day: 0.50    Types: Cigarettes   Smokeless tobacco: Never  Vaping Use   Vaping Use: Never  used  Substance and Sexual Activity   Alcohol use: Yes    Comment: socially   Drug use: Yes    Types: Marijuana   Sexual activity: Not on file

## 2021-07-26 ENCOUNTER — Encounter: Payer: BC Managed Care – PPO | Admitting: Orthopaedic Surgery

## 2021-07-26 ENCOUNTER — Ambulatory Visit (INDEPENDENT_AMBULATORY_CARE_PROVIDER_SITE_OTHER): Payer: BC Managed Care – PPO | Admitting: Orthopaedic Surgery

## 2021-07-26 ENCOUNTER — Ambulatory Visit: Payer: Self-pay

## 2021-07-26 ENCOUNTER — Other Ambulatory Visit: Payer: Self-pay

## 2021-07-26 ENCOUNTER — Encounter: Payer: Self-pay | Admitting: Orthopaedic Surgery

## 2021-07-26 VITALS — BP 142/89 | Ht 70.0 in | Wt 168.0 lb

## 2021-07-26 DIAGNOSIS — Z981 Arthrodesis status: Secondary | ICD-10-CM

## 2021-07-29 NOTE — Progress Notes (Signed)
   Post-Op Visit Note   [name]           Date of Birth: [birth year]           MRN: [id number] Visit Date: 07/26/2021 PCP: Patient, No Pcp Per (Inactive)   Assessment & Plan: Patient got good relief of his preop symptoms.  Middle graft appears healing slightly slower and he can discontinue his collar.  Patient states he wants to wear it when he is working for a period of time.  He can follow-up in 2 months for single lateral x-ray.  Chief Complaint:  Chief Complaint  Patient presents with   Neck - Follow-up    05/03/2021 C4-5, C5-6, C6-7 ACDF   Visit Diagnoses:  1. S/P cervical spinal fusion     Plan: Return 2 months lateral cervical spine image on return.  Follow-Up Instructions: Return in about 2 months (around 09/25/2021).   Orders:  Orders Placed This Encounter  Procedures   XR Cervical Spine 2 or 3 views   No orders of the defined types were placed in this encounter.   Imaging: No results found.  PMFS History: Patient Active Problem List   Diagnosis Date Noted   S/P cervical spinal fusion 05/10/2021   Cervical spinal stenosis 05/03/2021   Spinal stenosis of cervical region 02/11/2021   Past Medical History:  Diagnosis Date   Complication of anesthesia    hard to wake up, low resting heart rate   DDD (degenerative disc disease), cervical    High cholesterol    Scoliosis     No family history on file.  Past Surgical History:  Procedure Laterality Date   ANTERIOR CERVICAL DECOMP/DISCECTOMY FUSION N/A 05/03/2021   Procedure: C4-5, C5-6, C6-7 ANTERIOR CERVICAL DISCECTOMY FUSION, ALLOGRAFT AND PLATE;  Surgeon: Eldred Manges, MD;  Location: [facility] OR;  Service: Orthopedics;  Laterality: N/A;   HERNIA REPAIR     ROTATOR CUFF REPAIR Left    Social History   Occupational History   Not on file  Tobacco Use   Smoking status: Every Day    Packs/day: 0.50    Types: Cigarettes   Smokeless tobacco: Never  Vaping Use   Vaping Use: Never used   Substance and Sexual Activity   Alcohol use: Yes    Comment: socially   Drug use: Yes    Types: Marijuana   Sexual activity: Not on file

## 2021-09-25 ENCOUNTER — Encounter: Payer: Self-pay | Admitting: Orthopaedic Surgery

## 2021-09-25 ENCOUNTER — Other Ambulatory Visit: Payer: Self-pay

## 2021-09-25 ENCOUNTER — Ambulatory Visit: Payer: Self-pay

## 2021-09-25 ENCOUNTER — Ambulatory Visit (INDEPENDENT_AMBULATORY_CARE_PROVIDER_SITE_OTHER): Payer: BC Managed Care – PPO | Admitting: Orthopaedic Surgery

## 2021-09-25 VITALS — BP 140/86 | HR 84

## 2021-09-25 DIAGNOSIS — Z981 Arthrodesis status: Secondary | ICD-10-CM | POA: Diagnosis not present

## 2021-09-25 NOTE — Progress Notes (Signed)
Office Visit Note   [name]           Date of Birth: 1966-07-06           MRN: [id number] Visit Date: 09/25/2021              Requested by: No referring provider defined for this encounter. PCP: Patient, No Pcp Per (Inactive)   Assessment & Plan: Visit Diagnoses:  1. S/P cervical spinal fusion     Plan: We will release patient from care he is happy the surgical result good relief of preop symptoms.  Follow-up as needed.  Follow-Up Instructions: No follow-ups on file.   Orders:  Orders Placed This Encounter  Procedures   XR Cervical Spine 1 View   No orders of the defined types were placed in this encounter.     Procedures: No procedures performed   Clinical Data: No additional findings.   Subjective: Chief Complaint  Patient presents with   Neck - Follow-up    HPI patient returns post three-level cervical fusion with spinal stenosis.  He is now 4-1/2 months out from surgery.  He still notes some trace numbness in his left arm but no weakness.  He does occasionally use Tylenol if he had symptoms.  He has noticed progressive improvement in the last month.  No gait problems no lower extremity weakness.  Review of Systems updated unchanged.   Objective: Vital Signs: BP 140/86 (BP Location: Right Arm, Patient Position: Sitting, Cuff Size: Normal)   Pulse 84   SpO2 94%   Physical Exam Constitutional:      Appearance: He is well-developed.  HENT:     Head: Normocephalic and atraumatic.     Right Ear: External ear normal.     Left Ear: External ear normal.  Eyes:     Pupils: Pupils are equal, round, and reactive to light.  Neck:     Thyroid: No thyromegaly.     Trachea: No tracheal deviation.  Cardiovascular:     Rate and Rhythm: Normal rate.  Pulmonary:     Effort: Pulmonary effort is normal.     Breath sounds: No wheezing.  Abdominal:     General: Bowel sounds are normal.     Palpations: Abdomen is soft.  Musculoskeletal:      Cervical back: Neck supple.  Skin:    General: Skin is warm and dry.     Capillary Refill: Capillary refill takes less than 2 seconds.  Neurological:     Mental Status: He is alert and oriented to person, place, and time.  Psychiatric:        Behavior: Behavior normal.        Thought Content: Thought content normal.        Judgment: Judgment normal.    Ortho Exam good strength upper extremities normal gait pattern.  Specialty Comments:  No specialty comments available.  Imaging: XR Cervical Spine 1 View  Result Date: 09/25/2021 Single lateral cervical spine images obtained and reviewed this shows three-level cervical fusion C4-5 to C6-7.  There is been some progressive graft incorporation at all levels.  No loosening of the hardware good position and alignment. Impression: Satisfactory 3 level cervical fusion C4-C7 with allograft and plate.    PMFS History: Patient Active Problem List   Diagnosis Date Noted   S/P cervical spinal fusion 05/10/2021   Cervical spinal stenosis 05/03/2021   Spinal stenosis of cervical region 02/11/2021   Past Medical History:  Diagnosis Date  Complication of anesthesia    hard to wake up, low resting heart rate   DDD (degenerative disc disease), cervical    High cholesterol    Scoliosis     History reviewed. No pertinent family history.  Past Surgical History:  Procedure Laterality Date   ANTERIOR CERVICAL DECOMP/DISCECTOMY FUSION N/A 05/03/2021   Procedure: C4-5, C5-6, C6-7 ANTERIOR CERVICAL DISCECTOMY FUSION, ALLOGRAFT AND PLATE;  Surgeon: Eldred Manges, MD;  Location: [facility] OR;  Service: Orthopedics;  Laterality: N/A;   HERNIA REPAIR     ROTATOR CUFF REPAIR Left    Social History   Occupational History   Not on file  Tobacco Use   Smoking status: Every Day    Packs/day: 0.50    Types: Cigarettes   Smokeless tobacco: Never  Vaping Use   Vaping Use: Never used  Substance and Sexual Activity   Alcohol use: Yes    Comment: socially    Drug use: Yes    Types: Marijuana   Sexual activity: Not on file

## 2022-05-01 IMAGING — MR MR CERVICAL SPINE W/O CM
5 series · 39 of 48 positions shown · non-contrast
Comparison: Radiography 03/06/2020

CLINICAL DATA: Chronic neck pain worse on the left. Weakness of
both arms.

EXAM:
MRI CERVICAL SPINE WITHOUT CONTRAST
TECHNIQUE: Multiplanar, multisequence MR imaging of the cervical spine was
performed. No intravenous contrast was administered.

[Series 3: T2 · sagittal · 3.0mm · 0.82mm/px · 6 of 12 slices shown (1 of 2)]
[im 1/12]
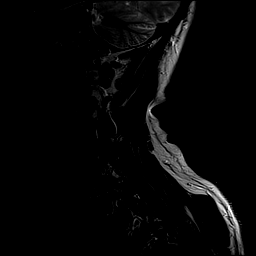
[im 3/12]
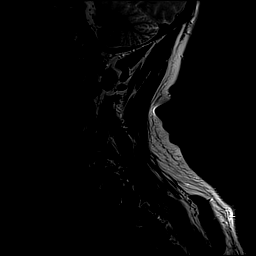
[im 5/12]
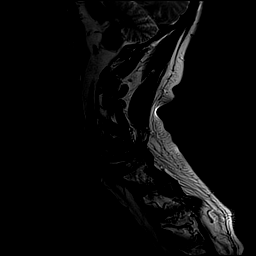
[im 7/12]
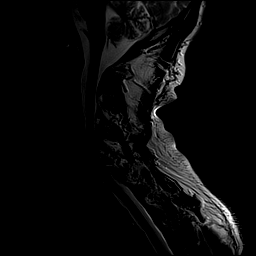
[im 9/12]
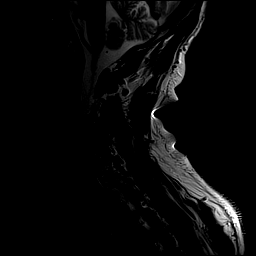
[im 12/12]
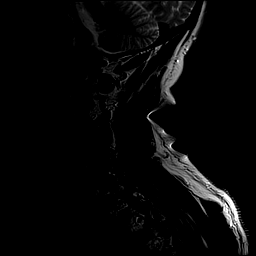

[Series 4: T1 · sagittal · 3.0mm · 0.82mm/px · 7 of 12 slices shown]
[im 1/12]
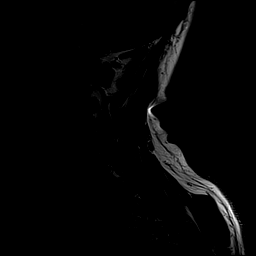
[im 2/12]
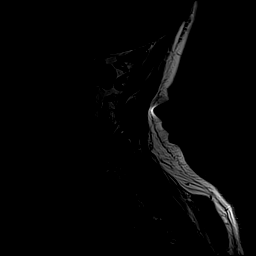
[im 4/12]
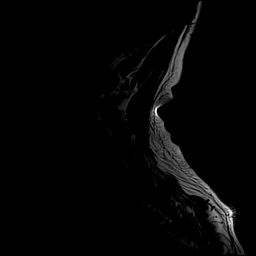
[im 6/12]
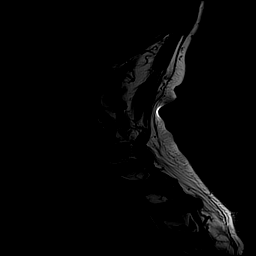
[im 8/12]
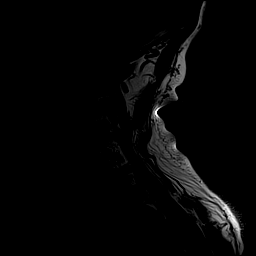
[im 10/12]
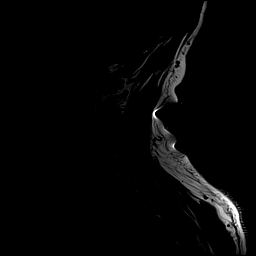
[im 12/12]
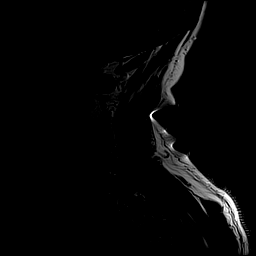

[Series 5: STIR · sagittal · 3.0mm · 0.82mm/px · 7 of 12 slices shown]
[im 1/12]
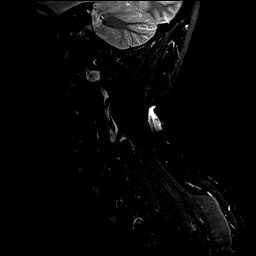
[im 2/12]
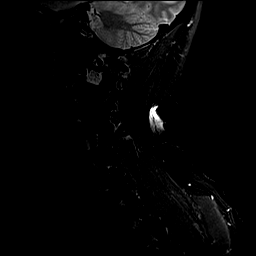
[im 4/12]
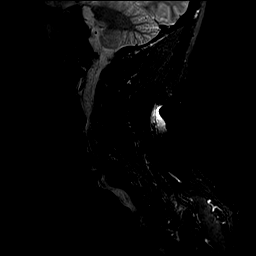
[im 6/12]
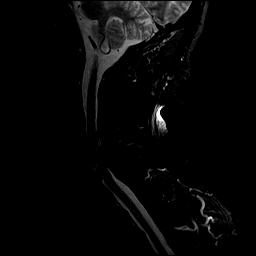
[im 8/12]
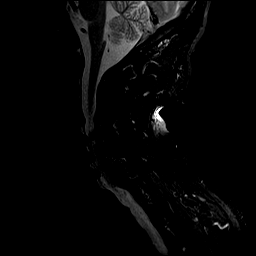
[im 10/12]
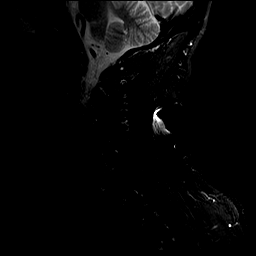
[im 12/12]
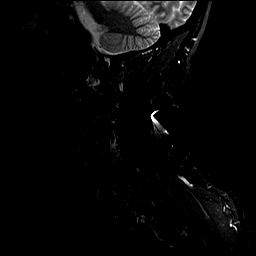

[Series 6: T2 · axial · 3.0mm · 0.70mm/px · z∈[-73,+18]mm · 11 of 26 slices shown (2 of 2)]
[im 1/26]
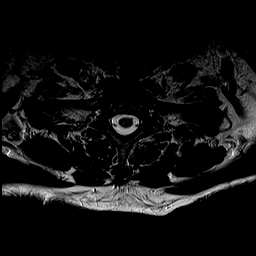
[im 2/26]
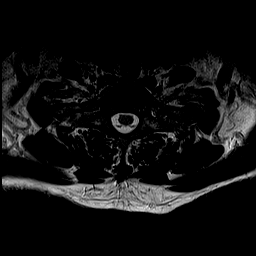
[im 4/26]
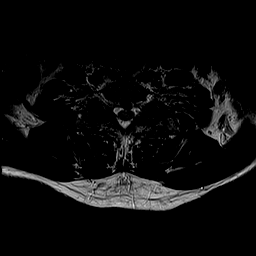
[im 6/26]
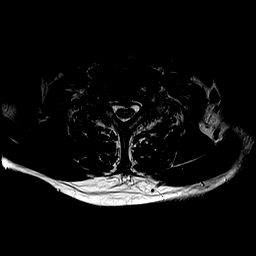
[im 8/26]
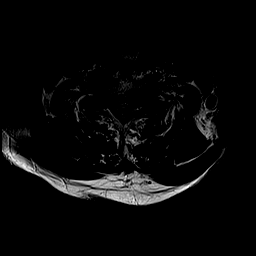
[im 10/26]
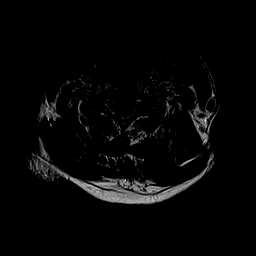
[im 12/26]
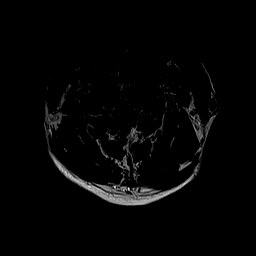
[im 14/26]
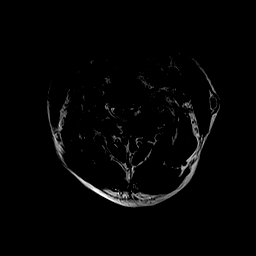
[im 18/26]
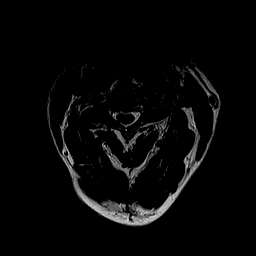
[im 22/26]
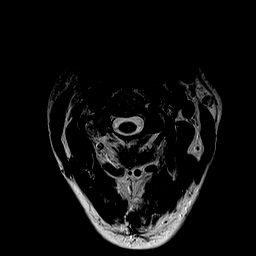
[im 26/26]
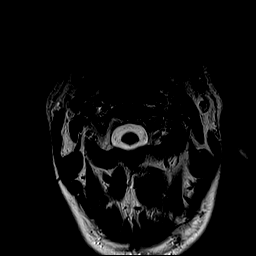

[Series 7: GRE · axial · 3.0mm · 0.35mm/px · z∈[-73,+18]mm · 8 of 26 slices shown]
[im 1/26]
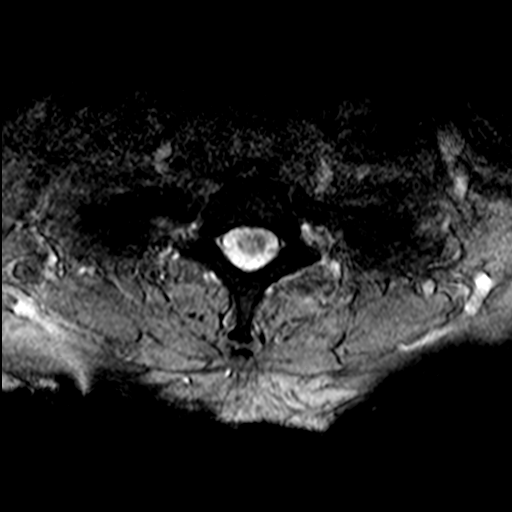
[im 4/26]
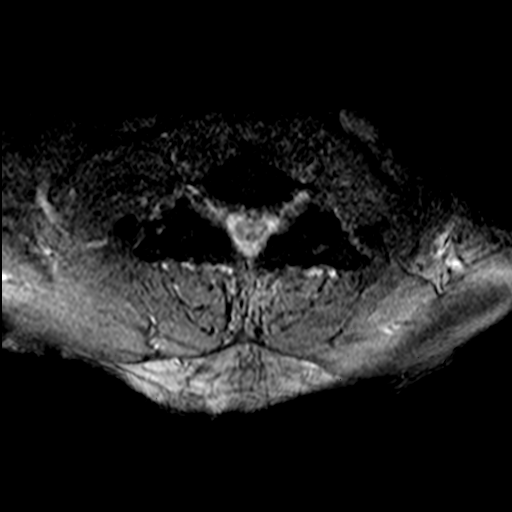
[im 8/26]
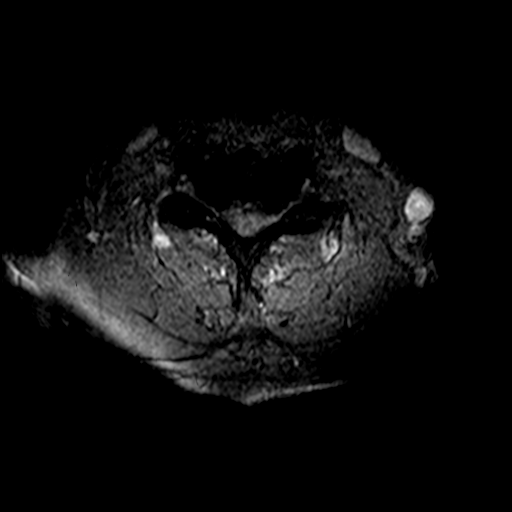
[im 12/26]
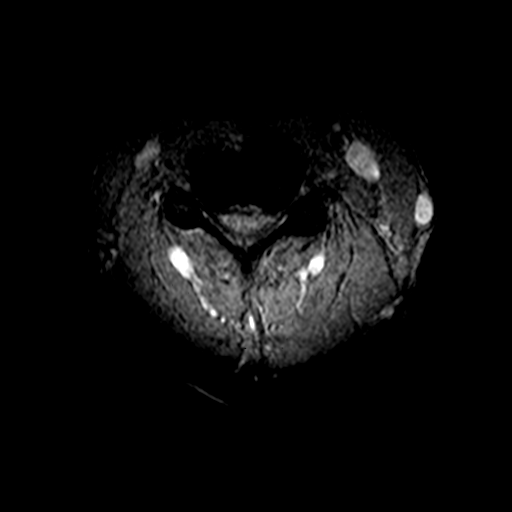
[im 14/26]
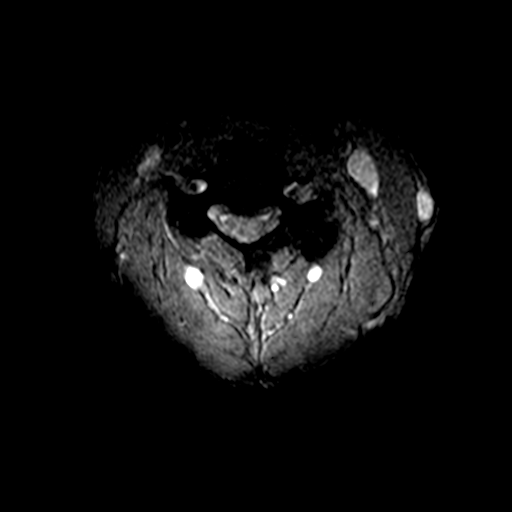
[im 18/26]
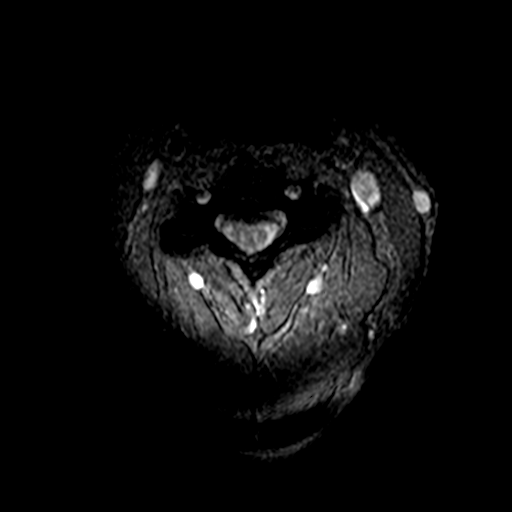
[im 22/26]
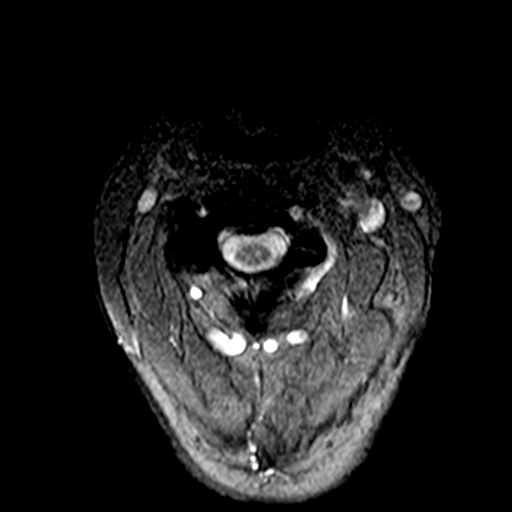
[im 26/26]
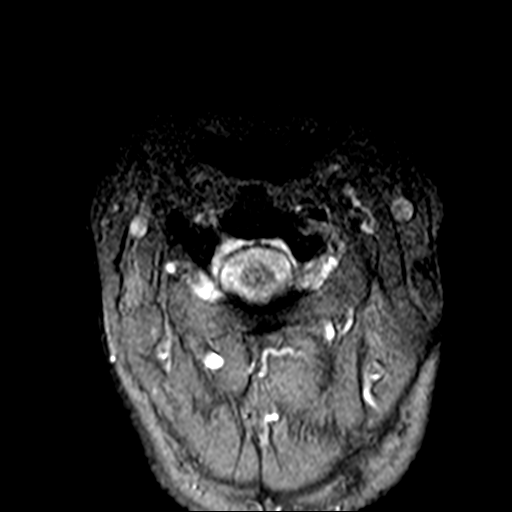

[39 of 48 positions shown; findings below may reference images not displayed]

FINDINGS: Alignment: 2 mm degenerative anterolisthesis at C7-T1. Otherwise
normal alignment.

Vertebrae: No fracture or primary bone lesion.

Cord: No primary cord lesion.  See below regarding stenosis.

Posterior Fossa, vertebral arteries, paraspinal tissues: Negative

Disc levels:

Foramen magnum widely patent. Mild osteoarthritis at the C1-2
articulation but without encroachment upon the neural structures.

C2-3: Facet joint fusion on the right. No disc pathology. No
stenosis.

C3-4: Spondylosis with endplate osteophytes and bulging disc
material. No central canal stenosis. Bilateral foraminal stenosis
could affect either C4 nerve.

C4-5: Spondylosis with endplate osteophytes and bulging of the disc
more towards the left. Pronounced facet degeneration and hypertrophy
on the left. Canal stenosis with AP diameter in the midline as
narrow as 7.6 mm. Effacement of the subarachnoid space and some
triangulation of cord. Bilateral foraminal stenosis, left more than
right. Either C5 nerve could be affected, particularly the left.

C5-6: Spondylosis with endplate osteophytes and shallow disc
herniation. Severe spinal stenosis with AP diameter of the canal
only 5.2 mm. Effacement of the subarachnoid space and flattening of
the cord. No abnormal cord T2 signal. Bilateral foraminal stenosis
could compress either or both C6 nerves.

C6-7: Endplate osteophytes and broad-based herniation of disc
material. Spinal stenosis with AP diameter of the canal 7.6 mm.
Effacement of the subarachnoid space with slight deformity of the
cord. Bilateral foraminal stenosis could affect either C7 nerve.

C7-T1: Bilateral facet arthropathy with 2 mm of anterolisthesis.
Central disc herniation with upward migration of disc material
behind C7. No compressive canal stenosis despite these findings.
Bilateral foraminal narrowing could affect either C8 nerve.

T1-2 and T2-3 show mild non-compressive disc bulges.
IMPRESSION: 1. C3-4: Bilateral foraminal stenosis could affect either C4 nerve.
2. C4-5: Spinal stenosis with AP diameter of the canal 7.6 mm.
Pronounced facet arthropathy particularly on the left. Effacement of
the subarachnoid space and slight triangulation of the cord.
Bilateral foraminal stenosis could affect either or both C5 nerves,
particularly the left.
3. C5-6: Severe spinal stenosis with AP diameter of the canal only
5.2 mm. Effacement of the subarachnoid space and flattening of the
cord. Bilateral foraminal stenosis could compress either or both C6
nerves. No abnormal T2 signal visible within the cord, but the
patient would be at risk of myelopathy based on this degree of
stenosis.
4. C6-7: Spinal stenosis with AP diameter of the canal 7.6 mm.
Effacement of the subarachnoid space and slight deformity of the
cord. Bilateral foraminal stenosis could affect either C7 nerve.
5. C7-T1: Bilateral facet arthropathy with 2 mm of anterolisthesis.
Central disc herniation with upward migration of disc material
behind C7. No compressive canal stenosis despite these findings.
Bilateral foraminal stenosis could affect either C8 nerve.

## 2023-09-01 ENCOUNTER — Telehealth (HOSPITAL_COMMUNITY): Payer: Self-pay | Admitting: Professional

## 2023-09-04 ENCOUNTER — Telehealth (HOSPITAL_COMMUNITY): Payer: Self-pay | Admitting: Professional

## 2023-09-04 ENCOUNTER — Other Ambulatory Visit (HOSPITAL_COMMUNITY): Payer: Self-pay | Attending: Psychiatry

## 2023-09-11 ENCOUNTER — Other Ambulatory Visit (HOSPITAL_COMMUNITY): Payer: Self-pay | Attending: Psychiatry | Admitting: Licensed Clinical Social Worker

## 2023-09-11 DIAGNOSIS — F332 Major depressive disorder, recurrent severe without psychotic features: Secondary | ICD-10-CM | POA: Insufficient documentation
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
## 2023-09-17 ENCOUNTER — Other Ambulatory Visit: Payer: Self-pay

## 2023-09-17 ENCOUNTER — Emergency Department (HOSPITAL_BASED_OUTPATIENT_CLINIC_OR_DEPARTMENT_OTHER): Payer: BC Managed Care – PPO

## 2023-09-17 ENCOUNTER — Observation Stay (HOSPITAL_BASED_OUTPATIENT_CLINIC_OR_DEPARTMENT_OTHER)
Admission: EM | Admit: 2023-09-17 | Discharge: 2023-09-18 | Disposition: A | Discharge status: Home / Self Care | Payer: BC Managed Care – PPO | Attending: Internal Medicine | Admitting: Internal Medicine

## 2023-09-17 ENCOUNTER — Encounter (HOSPITAL_BASED_OUTPATIENT_CLINIC_OR_DEPARTMENT_OTHER): Payer: Self-pay | Admitting: Emergency Medicine

## 2023-09-17 ENCOUNTER — Observation Stay (HOSPITAL_COMMUNITY): Payer: BC Managed Care – PPO

## 2023-09-17 ENCOUNTER — Ambulatory Visit (HOSPITAL_COMMUNITY): Payer: Self-pay

## 2023-09-17 ENCOUNTER — Telehealth (HOSPITAL_COMMUNITY): Payer: Self-pay | Admitting: Licensed Clinical Social Worker

## 2023-09-17 DIAGNOSIS — N400 Enlarged prostate without lower urinary tract symptoms: Secondary | ICD-10-CM | POA: Diagnosis present

## 2023-09-17 DIAGNOSIS — G8929 Other chronic pain: Secondary | ICD-10-CM | POA: Diagnosis present

## 2023-09-17 DIAGNOSIS — Z720 Tobacco use: Secondary | ICD-10-CM | POA: Diagnosis present

## 2023-09-17 DIAGNOSIS — R42 Dizziness and giddiness: Secondary | ICD-10-CM | POA: Diagnosis not present

## 2023-09-17 DIAGNOSIS — Z79899 Other long term (current) drug therapy: Secondary | ICD-10-CM | POA: Diagnosis not present

## 2023-09-17 DIAGNOSIS — F17210 Nicotine dependence, cigarettes, uncomplicated: Secondary | ICD-10-CM | POA: Insufficient documentation

## 2023-09-17 DIAGNOSIS — G629 Polyneuropathy, unspecified: Secondary | ICD-10-CM | POA: Diagnosis not present

## 2023-09-17 DIAGNOSIS — M5416 Radiculopathy, lumbar region: Secondary | ICD-10-CM | POA: Diagnosis not present

## 2023-09-17 LAB — CBC
HCT: 44.3 % (ref 39.0–52.0)
Hemoglobin: 14.7 g/dL (ref 13.0–17.0)
MCH: 29.9 pg (ref 26.0–34.0)
MCHC: 33.2 g/dL (ref 30.0–36.0)
MCV: 90.2 fL (ref 80.0–100.0)
Platelets: 221 10*3/uL (ref 150–400)
RBC: 4.91 MIL/uL (ref 4.22–5.81)
RDW: 13.3 % (ref 11.5–15.5)
WBC: 9.3 10*3/uL (ref 4.0–10.5)
nRBC: 0 % (ref 0.0–0.2)

## 2023-09-17 LAB — COMPREHENSIVE METABOLIC PANEL
ALT: 27 U/L (ref 0–44)
AST: 25 U/L (ref 15–41)
Albumin: 4.2 g/dL (ref 3.5–5.0)
Alkaline Phosphatase: 90 U/L (ref 38–126)
Anion gap: 10 (ref 5–15)
BUN: 12 mg/dL (ref 6–20)
CO2: 26 mmol/L (ref 22–32)
Calcium: 9.4 mg/dL (ref 8.9–10.3)
Chloride: 106 mmol/L (ref 98–111)
Creatinine, Ser: 1.11 mg/dL (ref 0.61–1.24)
GFR, Estimated: >60 mL/min (ref >60)
Glucose, Bld: 95 mg/dL (ref 70–99)
Potassium: 4.3 mmol/L (ref 3.5–5.1)
Sodium: 142 mmol/L (ref 135–145)
Total Bilirubin: 0.9 mg/dL (ref <1.2)
Total Protein: 7.4 g/dL (ref 6.5–8.1)

## 2023-09-17 LAB — DIFFERENTIAL
Abs Immature Granulocytes: 0.03 10*3/uL (ref 0.00–0.07)
Basophils Absolute: 0.1 10*3/uL (ref 0.0–0.1)
Basophils Relative: 1 %
Eosinophils Absolute: 0.2 10*3/uL (ref 0.0–0.5)
Eosinophils Relative: 2 %
Immature Granulocytes: 0 %
Lymphocytes Relative: 21 %
Lymphs Abs: 2 10*3/uL (ref 0.7–4.0)
Monocytes Absolute: 0.6 10*3/uL (ref 0.1–1.0)
Monocytes Relative: 7 %
Neutro Abs: 6.4 10*3/uL (ref 1.7–7.7)
Neutrophils Relative %: 69 %

## 2023-09-17 LAB — URINALYSIS, ROUTINE W REFLEX MICROSCOPIC
Bilirubin Urine: NEGATIVE
Glucose, UA: NEGATIVE mg/dL
Hgb urine dipstick: NEGATIVE
Ketones, ur: NEGATIVE mg/dL
Leukocytes,Ua: NEGATIVE
Nitrite: NEGATIVE
Protein, ur: NEGATIVE mg/dL
Specific Gravity, Urine: 1.01 (ref 1.005–1.030)
pH: 7 (ref 5.0–8.0)

## 2023-09-17 LAB — RAPID URINE DRUG SCREEN, HOSP PERFORMED
Amphetamines: NOT DETECTED
Barbiturates: NOT DETECTED
Benzodiazepines: NOT DETECTED
Cocaine: NOT DETECTED
Opiates: NOT DETECTED
Tetrahydrocannabinol: POSITIVE — AB

## 2023-09-17 LAB — PROTIME-INR
INR: 1 (ref 0.8–1.2)
Prothrombin Time: 13.9 s (ref 11.4–15.2)

## 2023-09-17 LAB — APTT: aPTT: 31 s (ref 24–36)

## 2023-09-17 LAB — ETHANOL: Alcohol, Ethyl (B): <10 mg/dL (ref <10)

## 2023-09-17 MED ORDER — STROKE: EARLY STAGES OF RECOVERY BOOK
Freq: Once | Status: AC
Start: 2023-09-18 — End: 2023-09-18
  Filled 2023-09-17: qty 1

## 2023-09-17 MED ORDER — ACETAMINOPHEN 650 MG RE SUPP: 650.0000 mg | RECTAL | Status: DC | PRN

## 2023-09-17 MED ORDER — ONDANSETRON HCL 4 MG/2ML IJ SOLN
4.0000 mg | Freq: Once | INTRAMUSCULAR | Status: AC
  Administered 2023-09-17: 4 mg via INTRAVENOUS
  Filled 2023-09-17: qty 2

## 2023-09-17 MED ORDER — SENNOSIDES-DOCUSATE SODIUM 8.6-50 MG PO TABS
1.0000 | ORAL_TABLET | Freq: Every evening | ORAL | Status: DC | PRN
Start: 2023-09-17 — End: 2023-09-18

## 2023-09-17 MED ORDER — ACETAMINOPHEN 160 MG/5ML PO SOLN: 650.0000 mg | ORAL | Status: DC | PRN

## 2023-09-17 MED ORDER — DULOXETINE HCL 30 MG PO CPEP
30.0000 mg | ORAL_CAPSULE | Freq: Every day | ORAL | Status: DC
  Administered 2023-09-18: 30 mg via ORAL
  Filled 2023-09-17: qty 1

## 2023-09-17 MED ORDER — ACETAMINOPHEN 325 MG PO TABS: 650.0000 mg | ORAL_TABLET | ORAL | Status: DC | PRN

## 2023-09-17 MED ORDER — ONDANSETRON HCL 4 MG/2ML IJ SOLN: 4.0000 mg | Freq: Four times a day (QID) | INTRAMUSCULAR | Status: DC | PRN

## 2023-09-17 MED ORDER — GABAPENTIN 300 MG PO CAPS
300.0000 mg | ORAL_CAPSULE | Freq: Every day | ORAL | Status: DC
  Administered 2023-09-17: 300 mg via ORAL
  Filled 2023-09-17 (×2): qty 1

## 2023-09-17 MED ORDER — PROCHLORPERAZINE EDISYLATE 10 MG/2ML IJ SOLN
5.0000 mg | Freq: Once | INTRAMUSCULAR | Status: AC
  Administered 2023-09-17: 5 mg via INTRAVENOUS
  Filled 2023-09-17: qty 2

## 2023-09-17 MED ORDER — ASPIRIN 81 MG PO TBEC
81.0000 mg | DELAYED_RELEASE_TABLET | Freq: Every day | ORAL | Status: DC
  Administered 2023-09-17: 81 mg via ORAL
  Filled 2023-09-17 (×3): qty 1

## 2023-09-17 MED ORDER — IOHEXOL 350 MG/ML SOLN
100.0000 mL | Freq: Once | INTRAVENOUS | Status: AC | PRN
  Administered 2023-09-17: 75 mL via INTRAVENOUS

## 2023-09-17 MED ORDER — ENOXAPARIN SODIUM 40 MG/0.4ML IJ SOSY: 40.0000 mg | PREFILLED_SYRINGE | INTRAMUSCULAR | Status: DC

## 2023-09-17 NOTE — ED Provider Notes (Signed)
[place] [facility] Provider Note   CSN: [id number] Arrival date & time: 09/17/23  1012     History  Chief Complaint  Patient presents with   Dizziness    Derek Burns is a 57 y.o. male.   Dizziness Patient sent from PCPs office.  Woke up at around 2 in the morning with dizziness.  Headache.  Some difficulty walking.  States it feels as if the room was spinning.  Has headache on the back bottom part of his head.  Went to bed at 9 PM. At PCPs office patient reportedly weak on the left side.  I reviewed the note from the visit.      Past Medical History:  Diagnosis Date   Complication of anesthesia    hard to wake up, low resting heart rate   DDD (degenerative disc disease), cervical    High cholesterol    Scoliosis     Home Medications Prior to Admission medications   Medication Sig Start Date End Date Taking? Authorizing Provider  cetirizine (ZYRTEC) 10 MG tablet Take 10 mg by mouth daily.    [provider]  methocarbamol (ROBAXIN) 500 MG tablet Take 1 tablet (500 mg total) by mouth every 6 (six) hours as needed for muscle spasms. Patient not taking: No sig reported 05/03/21   Naida Sleight, PA-C  Multiple Vitamin (MULTIVITAMIN WITH MINERALS) TABS tablet Take 1 tablet by mouth daily.    [provider]      Allergies    Patient has no known allergies.    Review of Systems   Review of Systems  Neurological:  Positive for dizziness.    Physical Exam Updated Vital Signs BP 117/81   Pulse (!) 44   Temp 98.4 F (36.9 C) (Oral)   Resp 16   Ht 5\' 10"  (1.778 m)   Wt 68 kg   SpO2 95%   BMI 21.52 kg/m  Physical Exam Vitals and nursing note reviewed.  Constitutional:      Comments: Sitting in bed with eyes closed.  HENT:     Head: Normocephalic.  Eyes:     Extraocular Movements: Extraocular movements intact.     Comments: Some nystagmus.  Eye movements grossly intact.  Neck:     Comments:  Tenderness on superior aspect of neck. Abdominal:     Tenderness: There is no abdominal tenderness.  Neurological:     Mental Status: He is alert.     Comments: Face grossly symmetric.  Some nystagmus but eye movements intact.  Does have visual fields grossly intact but difficulty with lateral vision on the left and that he got the number of fingers counted wrong.  Finger-nose is off bilaterally.  Potentially has decreased strength on the left compared to the right.     ED Results / Procedures / Treatments   Labs (all labs ordered are listed, but only abnormal results are displayed) Labs Reviewed  ETHANOL  PROTIME-INR  APTT  CBC  DIFFERENTIAL  COMPREHENSIVE METABOLIC PANEL  RAPID URINE DRUG SCREEN, HOSP PERFORMED  URINALYSIS, ROUTINE W REFLEX MICROSCOPIC    EKG EKG Interpretation Date/Time:  Thursday September 17 2023 10:51:33 EST Ventricular Rate:  54 PR Interval:  146 QRS Duration:  81 QT Interval:  424 QTC Calculation: 402 R Axis:   67  Text Interpretation: Sinus rhythm Probable left atrial enlargement Confirmed by Benjiman Core [id number]) on 09/17/2023 11:30:56 AM  Radiology CT ANGIO HEAD NECK W WO CM  Result Date: 09/17/2023 CLINICAL DATA:  Vertigo, central EXAM: CT ANGIOGRAPHY HEAD AND NECK WITH AND WITHOUT CONTRAST TECHNIQUE: Multidetector CT imaging of the head and neck was performed using the standard protocol during bolus administration of intravenous contrast. Multiplanar CT image reconstructions and MIPs were obtained to evaluate the vascular anatomy. Carotid stenosis measurements (when applicable) are obtained utilizing NASCET criteria, using the distal internal carotid diameter as the denominator. RADIATION DOSE REDUCTION: This exam was performed according to the departmental dose-optimization program which includes automated exposure control, adjustment of the mA and/or kV according to patient size and/or use of iterative reconstruction technique. CONTRAST:  75mL  OMNIPAQUE IOHEXOL 350 MG/ML SOLN COMPARISON:  None Available. FINDINGS: CT HEAD FINDINGS Brain: No evidence of acute infarction, hemorrhage, hydrocephalus, extra-axial collection or mass lesion/mass effect. Vascular: See below. Skull: No acute fracture. Sinuses/Orbits: Clear sinuses.  No acute orbital findings. Other: No mastoid effusions. Review of the MIP images confirms the above findings CTA NECK FINDINGS Aortic arch: Great vessel origins are patent without significant stenosis. Right carotid system: No evidence of dissection, stenosis (50% or greater), or occlusion. Left carotid system: No evidence of dissection, stenosis (50% or greater), or occlusion. Vertebral arteries: Codominant. No evidence of dissection, stenosis (50% or greater), or occlusion. Skeleton: No acute abnormality on limited assessment.  ACDF. Other neck: No acute abnormality on limited assessment. Upper chest: Visualized lung apices are clear. Review of the MIP images confirms the above findings CTA HEAD FINDINGS Anterior circulation: Bilateral intracranial ICAs, MCAs, and ACAs are patent without proximal hemodynamically significant stenosis. Posterior circulation: Bilateral intradural vertebral arteries, basilar artery and bilateral posterior cerebral arteries are patent without proximal hemodynamically significant stenosis. Venous sinuses: As permitted by contrast timing, patent. Review of the MIP images confirms the above findings IMPRESSION: 1. No emergent large vessel occlusion or proximal hemodynamically significant stenosis. 2. No evidence of acute intracranial abnormality. Electronically Signed   By: Feliberto Harts M.D.   On: 09/17/2023 12:37    Procedures Procedures    Medications Ordered in ED Medications  ondansetron (ZOFRAN) injection 4 mg (4 mg Intravenous Given 09/17/23 1037)  iohexol (OMNIPAQUE) 350 MG/ML injection 100 mL (75 mLs Intravenous Contrast Given 09/17/23 1151)  prochlorperazine (COMPAZINE) injection 5 mg  (5 mg Intravenous Given 09/17/23 1326)    ED Course/ Medical Decision Making/ A&P                                 Medical Decision Making Amount and/or Complexity of Data Reviewed Labs: ordered. Radiology: ordered.  Risk Prescription drug management.   Patient presents with headache and dizziness.  Vertiginous.  However also has some asymmetric weakness.  Feels somewhat weak all over but does appear weaker on the left compared to the right.  No visual field cut.  No neglect.  No aphasia.  Not LVO positive.  Last normal 9 PM last night.  Not a TNK candidate due to time of onset.  CT angio done however to rule out stroke and other causes such as vertebral dissection.  Angiography reassuring.  Blood work very reassuring.  However potentially does have stroke.  Will admit for further workup.  Also with headache will give some Compazine here.  Complicated migraine also considered.        Final Clinical Impression(s) / ED Diagnoses Final diagnoses:  Vertigo    Rx / DC Orders ED Discharge Orders     None  Benjiman Core, MD 09/17/23 [phone]

## 2023-09-17 NOTE — ED Triage Notes (Signed)
Dizziness and h/a  and back pain since 2 this am was at dr and sent here for further tests , states dr said  one side weaker than the other left side

## 2023-09-17 NOTE — Telephone Encounter (Signed)
Cln called to f/u on PHP ppw. Pt's wife answered the phone and cln did not disclose the nature of the call due to HIPAA. Pt's wife reported she and pt are currently in the ED and she gave the phone to pt. Pt reports he may be having a stroke. Cln expressed sympathy and advised pt that he is welcome to revisit PHP when he is feeling better.

## 2023-09-17 NOTE — H&P (Signed)
History and Physical    Derek WHOOLERY Burns:[id number] DOB: Sep 20, 1966 DOA: 09/17/2023  PCP: Jerrell Mylar, PA-C  Patient coming from: Home  I have personally briefly reviewed patient's old medical records in [facility]  Chief Complaint: Dizziness  HPI: Derek Burns is a 57 y.o. male with medical history significant for BPH, depression, chronic lumbar radicular pain who presented to the ED for evaluation of new onset dizziness and headache.  Patient reports he woke up around 2 AM this morning.  He stood up out of bed and experience significant dizziness with room spinning sensation.  He had difficulty walking due to the dizziness.  He did have some ringing sensation in his ears as well that has since resolved.  Patient states that dizziness also resolves when he sits down to rest for a while.  Dizziness has not occurred while at rest, he only associates it with changing position/standing.  He has had intermittent frontal headache associated with these episodes.  He has not lost consciousness.  He denies chest pain, dyspnea, nausea, vomiting, abdominal pain, dysuria.  Patient states that he has had recent medication changes including increasing Flomax dose for BPH as well as addition of duloxetine for depression and neuropathy.  Patient reports smoking about half pack cigarettes daily.  He denies any alcohol use.  [facility] ED Course  Labs/Imaging on admission: I have personally reviewed following labs and imaging studies.  Initial vitals showed BP 139/98, pulse 47, RR 20, temp 98.4 F, SpO2 96% on room air.  Labs show sodium 142, potassium 4.3, bicarb 26, BUN 12, creatinine 1.11, serum leukocyte 5, LFTs within normal limits, WBC 9.3, hemoglobin 14.7, platelets 221,000.  UA negative for UTI.  UDS positive for THC.  Serum ethanol undetectable.  CT head negative for acute intracranial normality.  CTA head/neck negative for emergent LVO or hemodynamically  significant stenosis.  Patient was given Zofran, Compazine.  The hospitalist service was consulted to admit for further evaluation.  Review of Systems: All systems reviewed and are negative except as documented in history of present illness above.   Past Medical History:  Diagnosis Date   Complication of anesthesia    hard to wake up, low resting heart rate   DDD (degenerative disc disease), cervical    High cholesterol    Scoliosis     Past Surgical History:  Procedure Laterality Date   ANTERIOR CERVICAL DECOMP/DISCECTOMY FUSION N/A 05/03/2021   Procedure: C4-5, C5-6, C6-7 ANTERIOR CERVICAL DISCECTOMY FUSION, ALLOGRAFT AND PLATE;  Surgeon: Eldred Manges, MD;  Location: [facility] OR;  Service: Orthopedics;  Laterality: N/A;   HERNIA REPAIR     ROTATOR CUFF REPAIR Left     Social History:  reports that he has been smoking cigarettes. He has never used smokeless tobacco. He reports current alcohol use. He reports current drug use. Drug: Marijuana.  No Known Allergies  History reviewed. No pertinent family history.   Prior to Admission medications   Medication Sig Start Date End Date Taking? Authorizing Provider  cetirizine (ZYRTEC) 10 MG tablet Take 10 mg by mouth daily.    [provider]  methocarbamol (ROBAXIN) 500 MG tablet Take 1 tablet (500 mg total) by mouth every 6 (six) hours as needed for muscle spasms. Patient not taking: No sig reported 05/03/21   Naida Sleight, PA-C  Multiple Vitamin (MULTIVITAMIN WITH MINERALS) TABS tablet Take 1 tablet by mouth daily.    [provider]    Physical Exam:  Vitals:   09/17/23 1315 09/17/23 1500 09/17/23 1700 09/17/23 1802  BP: 117/81 109/79 111/81 135/86  Pulse: (!) 44 (!) 44 (!) 50 (!) 50  Resp: 16 15 13 15   Temp:  98.4 F (36.9 C)  98.8 F (37.1 C)  TempSrc:  Oral  Oral  SpO2: 95% 96% 95%   Weight:      Height:       Constitutional: Resting in bed, NAD, calm, comfortable Eyes: EOMI, lids and conjunctivae  normal ENMT: Mucous membranes are moist. Posterior pharynx clear of any exudate or lesions.Normal dentition.  Neck: normal, supple, no masses. Respiratory: clear to auscultation bilaterally, no wheezing, no crackles. Normal respiratory effort. No accessory muscle use.  Cardiovascular: Regular rate and rhythm, no murmurs / rubs / gallops. No extremity edema. 2+ pedal pulses. Abdomen: no tenderness, no masses palpated. No hepatosplenomegaly. Bowel sounds positive.  Musculoskeletal: no clubbing / cyanosis. No joint deformity upper and lower extremities. Good ROM, no contractures. Normal muscle tone.  Skin: no rashes, lesions, ulcers. No induration Neurologic: CN 2-12 grossly intact. Sensation intact. Strength 5/5 in all 4.  Psychiatric: Normal judgment and insight. Alert and oriented x 3. Normal mood.   EKG: Personally reviewed. Sinus rhythm, rate 54, no acute ischemic changes.  Rate is slower when compared to previous.  Assessment/Plan Principal Problem:   Dizziness Active Problems:   Chronic radicular lumbar pain   BPH (benign prostatic hyperplasia)   Tobacco use   Derek Burns is a 57 y.o. male with medical history significant for BPH, depression, chronic lumbar radicular pain who is admitted for evaluation of dizziness.  Assessment and Plan: Dizziness: New onset dizziness with room spinning sensation on positional change.  Significant difficulty ambulating due to the symptoms.  Transferred from [facility] for CVA eval.  CTA head/neck is negative. -Obtain MRI brain without contrast -PT/OT eval -Keep on telemetry, continue neurochecks -Check A1c, lipid panel -Defer further CVA workup pending MRI brain -If MRI brain negative then would consider symptoms more likely BPPV versus orthostasis -Hold Flomax and finasteride due to potentially contributing to symptoms -Check orthostatic vitals  BPH: Holding Flomax and finasteride.  Chronic back pain with neuropathy: Continue gabapentin  and Cymbalta.  Tobacco use: Patient report smoking about half pack per day.  He declines nicotine patch.  Smoking cessation advised.   DVT prophylaxis: enoxaparin (LOVENOX) injection 40 mg Start: 09/18/23 2200 Code Status: Full code, discussed with patient on admission Family Communication: Discussed with patient, he has discussed with family Disposition Plan: From home, dispo pending clinical progress Consults called: None Severity of Illness: The appropriate patient status for this patient is OBSERVATION. Observation status is judged to be reasonable and necessary in order to provide the required intensity of service to ensure the patient's safety. The patient's presenting symptoms, physical exam findings, and initial radiographic and laboratory data in the context of their medical condition is felt to place them at decreased risk for further clinical deterioration. Furthermore, it is anticipated that the patient will be medically stable for discharge from the hospital within 2 midnights of admission.   Darreld Mclean MD [facility]  If 7PM-7AM, please contact night-coverage [email or web address]  09/17/2023, 7:44 PM

## 2023-09-17 NOTE — Hospital Course (Signed)
Derek Burns is a 57 y.o. male with medical history significant for BPH, depression, chronic lumbar radicular pain who is admitted for evaluation of dizziness.

## 2023-09-18 ENCOUNTER — Other Ambulatory Visit (HOSPITAL_COMMUNITY): Payer: Self-pay

## 2023-09-18 ENCOUNTER — Ambulatory Visit (HOSPITAL_COMMUNITY): Payer: Self-pay

## 2023-09-18 DIAGNOSIS — R42 Dizziness and giddiness: Secondary | ICD-10-CM | POA: Diagnosis not present

## 2023-09-18 LAB — BASIC METABOLIC PANEL
Anion gap: 9 (ref 5–15)
BUN: 10 mg/dL (ref 6–20)
CO2: 24 mmol/L (ref 22–32)
Calcium: 9 mg/dL (ref 8.9–10.3)
Chloride: 106 mmol/L (ref 98–111)
Creatinine, Ser: 0.99 mg/dL (ref 0.61–1.24)
GFR, Estimated: >60 mL/min (ref >60)
Glucose, Bld: 90 mg/dL (ref 70–99)
Potassium: 4.2 mmol/L (ref 3.5–5.1)
Sodium: 139 mmol/L (ref 135–145)

## 2023-09-18 LAB — LIPID PANEL
Cholesterol: 195 mg/dL (ref 0–200)
HDL: 33 mg/dL — ABNORMAL LOW (ref >40)
LDL Cholesterol: 135 mg/dL — ABNORMAL HIGH (ref 0–99)
Total CHOL/HDL Ratio: 5.9 {ratio}
Triglycerides: 136 mg/dL (ref <150)
VLDL: 27 mg/dL (ref 0–40)

## 2023-09-18 LAB — HEMOGLOBIN A1C
Hgb A1c MFr Bld: 5.8 % — ABNORMAL HIGH (ref 4.8–5.6)
Mean Plasma Glucose: 119.76 mg/dL

## 2023-09-18 LAB — HIV ANTIBODY (ROUTINE TESTING W REFLEX): HIV Screen 4th Generation wRfx: NONREACTIVE

## 2023-09-18 MED ORDER — MECLIZINE HCL 25 MG PO TABS
25.0000 mg | ORAL_TABLET | Freq: Three times a day (TID) | ORAL | Status: DC
  Administered 2023-09-18: 25 mg via ORAL
  Filled 2023-09-18 (×2): qty 1

## 2023-09-18 MED ORDER — ROSUVASTATIN CALCIUM 20 MG PO TABS
20.0000 mg | ORAL_TABLET | Freq: Every day | ORAL | Status: DC
  Administered 2023-09-18: 20 mg via ORAL
  Filled 2023-09-18: qty 1

## 2023-09-18 MED ORDER — ROSUVASTATIN CALCIUM 20 MG PO TABS
20.0000 mg | ORAL_TABLET | Freq: Every day | ORAL | 0 refills | Status: AC
  Filled 2023-09-18: qty 30, 30d supply, fill #0

## 2023-09-18 MED ORDER — MECLIZINE HCL 25 MG PO TABS
25.0000 mg | ORAL_TABLET | Freq: Three times a day (TID) | ORAL | 0 refills | Status: AC | PRN
  Filled 2023-09-18: qty 30, 10d supply, fill #0

## 2023-09-18 NOTE — Evaluation (Signed)
Occupational Therapy Evaluation Patient Details Name: Derek Burns MRN: [id number] DOB: Apr 18, 1966 Today's Date: 09/18/2023   History of Present Illness 57 y.o. male who presented to the ED for evaluation of new onset dizziness and headache. PMH significant for BPH, depression, chronic lumbar radicular pain, anterior cervical fusion   Clinical Impression   Pt admitted for above, dizziness has resolved but pt reports the ringing in his ear remains but is typically like that PTA. Pt completing ADLs and ambulating in hall independently, VSS, educated pt on outpatient PT vestibular services if desired to further assess concerns with ear and dizziness. Pt close to baseline and has no further acute skilled OT needs. No follow-up OT needed.        If plan is discharge home, recommend the following: Other (comment) (n/a)    Functional Status Assessment  Patient has not had a recent decline in their functional status  Equipment Recommendations  None recommended by OT    Recommendations for Other Services       Precautions / Restrictions Restrictions Weight Bearing Restrictions: No      Mobility Bed Mobility Overal bed mobility: Independent                  Transfers Overall transfer level: Independent Equipment used: None                      Balance Overall balance assessment: Mild deficits observed, not formally tested                                         ADL either performed or assessed with clinical judgement   ADL Overall ADL's : Independent                                       General ADL Comments: Pt completing transfers and LBD in room independently. He declined grooming ADLs at sink but anticipate that he will be independent in those as well     Vision   Vision Assessment?: No apparent visual deficits Additional Comments: No nystagmus noted, pt did complain of consistent ringing in r ear. eye alignemnt  and tracking [facility]     Perception         Praxis         Pertinent Vitals/Pain Pain Assessment Pain Assessment: No/denies pain     Extremity/Trunk Assessment Upper Extremity Assessment Upper Extremity Assessment: Overall WFL for tasks assessed   Lower Extremity Assessment Lower Extremity Assessment: Overall WFL for tasks assessed   Cervical / Trunk Assessment Cervical / Trunk Assessment: Kyphotic   Communication Communication Communication: No apparent difficulties   Cognition Arousal: Alert Behavior During Therapy: WFL for tasks assessed/performed Overall Cognitive Status: Within Functional Limits for tasks assessed                                       General Comments  VSS, HR in the 70s with mobility    Exercises     Shoulder Instructions      Home Living Family/patient expects to be discharged to:: Private residence Living Arrangements: Spouse/significant other Available Help at Discharge: Family;Available PRN/intermittently Type of Home: House Home Access: Stairs to enter Entrance  Stairs-Number of Steps: 1   Home Layout: One level     Bathroom Shower/Tub: Chief Strategy Officer: Standard     Home Equipment: Cane - single point   Additional Comments: builds dyes for work      Prior Functioning/Environment Prior Level of Function : Independent/Modified Independent;Driving;Working/employed             Mobility Comments: ind, may only use cane when back pain radiates down to Legs ADLs Comments: ind        OT Problem List: Other (comment) (dizziness)      OT Treatment/Interventions:      OT Goals(Current goals can be found in the care plan section) Acute Rehab OT Goals Patient Stated Goal: To go home OT Goal Formulation: All assessment and education complete, DC therapy Time For Goal Achievement: 10/02/23 Potential to Achieve Goals: Good  OT Frequency:      Co-evaluation              AM-PAC OT "6  Clicks" Daily Activity     Outcome Measure Help from another person eating meals?: None Help from another person taking care of personal grooming?: None Help from another person toileting, which includes using toliet, bedpan, or urinal?: None Help from another person bathing (including washing, rinsing, drying)?: None Help from another person to put on and taking off regular upper body clothing?: None Help from another person to put on and taking off regular lower body clothing?: None 6 Click Score: 24   End of Session Nurse Communication: Mobility status  Activity Tolerance: Patient tolerated treatment well Patient left: in bed;with call bell/phone within reach  OT Visit Diagnosis: Dizziness and giddiness (R42)                Time: 6962-9528 OT Time Calculation (min): 20 min Charges:  OT General Charges $OT Visit: 1 Visit OT Evaluation $OT Eval Low Complexity: 1 Low  09/18/2023  AB, OTR/L  Acute Rehabilitation Services  Office: [phone]   Tristan Schroeder 09/18/2023, 9:13 AM

## 2023-09-18 NOTE — Progress Notes (Signed)
PT Cancellation Note  Patient Details Name: Derek Burns MRN: [id number] DOB: July 09, 1966   Cancelled Treatment:    Reason Eval/Treat Not Completed: PT screened, no needs identified, will sign off (Per OT, pt has no acute PT needs. Will sign off.)   Gladys Damme 09/18/2023, 9:05 AM

## 2023-09-18 NOTE — Discharge Summary (Signed)
ALVI STUTEVILLE GUY:[id number] DOB: [birth year] DOA: 09/17/2023  PCP: Jerrell Mylar, PA-C  Admit date: 09/17/2023  Discharge date: 09/18/2023  Admitted From: Home   Disposition:  Home   Recommendations for Outpatient Follow-up:   Follow up with PCP in 1-2 weeks  PCP Please obtain BMP/CBC, 2 view CXR in 1week,  (see Discharge instructions)   PCP Please follow up on the following pending results:    Home Health: None   Equipment/Devices: None  Consultations: None  Discharge Condition: Stable    CODE STATUS: Full    Diet Recommendation: Heart Healthy     Chief Complaint  Patient presents with   Dizziness     Brief history of present illness from the day of admission and additional interim summary    57 y.o. male with medical history significant for BPH, depression, chronic lumbar radicular pain who presented to the ED for evaluation of new onset dizziness and headache.  He started having this sensation at night when he stood up from his bed, he had a spinning sensation with ringing in both ears and some nausea.  He had this off-and-on ongoing for a few days came to the ER and was admitted for further workup for dizziness.                                                                 Hospital Course   Vertigo caused by BPV   Patient has had it for 2 to 3 days with bilateral ear ringing and nausea, sensation of spinning, worse with head turning.  MRI head CTA head and neck unremarkable.  Much improved with meclizine.  Did well with PT and symptom-free now.  Discharge home with outpatient follow-up with PCP, if symptoms recur outpatient ENT follow-up.  Requested to cut down his salt intake and caffeine intake.   BPH: Sinew home medications.   Chronic back pain with neuropathy: Continue gabapentin  and Cymbalta.   Tobacco use: Patient report smoking about half pack per day.  He declines nicotine patch.  Smoking cessation advised.  Dyslipidemia.  Was not taking home statin.  Placed on Crestor.   Discharge diagnosis     Principal Problem:   Dizziness Active Problems:   Chronic radicular lumbar pain   BPH (benign prostatic hyperplasia)   Tobacco use    Discharge instructions    Discharge Instructions     Diet - low sodium heart healthy   Complete by: As directed    Discharge instructions   Complete by: As directed    Follow with Primary MD Jerrell Mylar, PA-C in 7 days   Get CBC, CMP, 2 view Chest X ray -  checked next visit with your primary MD    Activity: As tolerated with Full fall precautions use walker/cane &  assistance as needed  Disposition Home   Diet: Heart Healthy    Special Instructions: If you have smoked or chewed Tobacco  in the last 2 yrs please stop smoking, stop any regular Alcohol  and or any Recreational drug use.  On your next visit with your primary care physician please Get Medicines reviewed and adjusted.  Please request your Prim.MD to go over all Hospital Tests and Procedure/Radiological results at the follow up, please get all Hospital records sent to your Prim MD by signing hospital release before you go home.  If you experience worsening of your admission symptoms, develop shortness of breath, life threatening emergency, suicidal or homicidal thoughts you must seek medical attention immediately by calling 911 or calling your MD immediately  if symptoms less severe.  You Must read complete instructions/literature along with all the possible adverse reactions/side effects for all the Medicines you take and that have been prescribed to you. Take any new Medicines after you have completely understood and accpet all the possible adverse reactions/side effects.   Do not drive when taking Pain medications.  Do not take more than  prescribed Pain, Sleep and Anxiety Medications   Increase activity slowly   Complete by: As directed        Discharge Medications   Allergies as of 09/18/2023   No Known Allergies      Medication List     STOP taking these medications    pravastatin 20 MG tablet Commonly known as: PRAVACHOL       TAKE these medications    acetaminophen 500 MG tablet Commonly known as: TYLENOL Take 500-1,000 mg by mouth every 6 (six) hours as needed for moderate pain (pain score 4-6).   DULoxetine 30 MG capsule Commonly known as: CYMBALTA Take 30 mg by mouth daily.   finasteride 5 MG tablet Commonly known as: PROSCAR Take 5 mg by mouth daily.   gabapentin 300 MG capsule Commonly known as: NEURONTIN Take 300 mg by mouth at bedtime.   meclizine 25 MG tablet Commonly known as: ANTIVERT Take 1 tablet (25 mg total) by mouth 3 (three) times daily as needed for dizziness.   multivitamin with minerals Tabs tablet Take 1 tablet by mouth daily.   rosuvastatin 20 MG tablet Commonly known as: CRESTOR Take 1 tablet (20 mg total) by mouth daily. Start taking on: September 19, 2023   tamsulosin 0.4 MG Caps capsule Commonly known as: FLOMAX Take 0.4 mg by mouth daily.         Follow-up Information     Jerrell Mylar, PA-C. Schedule an appointment as soon as possible for a visit in 1 week(s).   Specialty: Internal Medicine Contact information: [address] [place] [address] [phone]                 Major procedures and Radiology Reports - PLEASE review detailed and final reports thoroughly  -      MR BRAIN WO CONTRAST  Result Date: 09/18/2023 CLINICAL DATA:  Initial evaluation for neuro deficit, stroke suspected. EXAM: MRI HEAD WITHOUT CONTRAST TECHNIQUE: Multiplanar, multiecho pulse sequences of the brain and surrounding structures were obtained without intravenous contrast. COMPARISON:  Prior study from earlier the same day. FINDINGS: Brain:  Cerebral volume within normal limits. No significant cerebral white matter disease for age. No evidence for acute or subacute infarct. Gray-white matter differentiation maintained. No areas of chronic cortical infarction. No acute or chronic intracranial blood products. No mass lesion, midline shift or  mass effect. No hydrocephalus or extra-axial fluid collection. Pituitary gland and suprasellar region within normal limits. Vascular: Major intracranial vascular flow voids are maintained. Skull and upper cervical spine: Craniocervical junction within normal limits. Bone marrow signal intensity normal. No scalp soft tissue abnormality. Sinuses/Orbits: Globes and orbital soft tissues within normal limits. Paranasal sinuses are largely clear. No significant mastoid effusion. Other: None. IMPRESSION: Normal brain MRI. No acute intracranial abnormality. Electronically Signed   By: Rise Mu M.D.   On: 09/18/2023 03:08   CT ANGIO HEAD NECK W WO CM  Result Date: 09/17/2023 CLINICAL DATA:  Vertigo, central EXAM: CT ANGIOGRAPHY HEAD AND NECK WITH AND WITHOUT CONTRAST TECHNIQUE: Multidetector CT imaging of the head and neck was performed using the standard protocol during bolus administration of intravenous contrast. Multiplanar CT image reconstructions and MIPs were obtained to evaluate the vascular anatomy. Carotid stenosis measurements (when applicable) are obtained utilizing NASCET criteria, using the distal internal carotid diameter as the denominator. RADIATION DOSE REDUCTION: This exam was performed according to the departmental dose-optimization program which includes automated exposure control, adjustment of the mA and/or kV according to patient size and/or use of iterative reconstruction technique. CONTRAST:  75mL OMNIPAQUE IOHEXOL 350 MG/ML SOLN COMPARISON:  None Available. FINDINGS: CT HEAD FINDINGS Brain: No evidence of acute infarction, hemorrhage, hydrocephalus, extra-axial collection or mass  lesion/mass effect. Vascular: See below. Skull: No acute fracture. Sinuses/Orbits: Clear sinuses.  No acute orbital findings. Other: No mastoid effusions. Review of the MIP images confirms the above findings CTA NECK FINDINGS Aortic arch: Great vessel origins are patent without significant stenosis. Right carotid system: No evidence of dissection, stenosis (50% or greater), or occlusion. Left carotid system: No evidence of dissection, stenosis (50% or greater), or occlusion. Vertebral arteries: Codominant. No evidence of dissection, stenosis (50% or greater), or occlusion. Skeleton: No acute abnormality on limited assessment.  ACDF. Other neck: No acute abnormality on limited assessment. Upper chest: Visualized lung apices are clear. Review of the MIP images confirms the above findings CTA HEAD FINDINGS Anterior circulation: Bilateral intracranial ICAs, MCAs, and ACAs are patent without proximal hemodynamically significant stenosis. Posterior circulation: Bilateral intradural vertebral arteries, basilar artery and bilateral posterior cerebral arteries are patent without proximal hemodynamically significant stenosis. Venous sinuses: As permitted by contrast timing, patent. Review of the MIP images confirms the above findings IMPRESSION: 1. No emergent large vessel occlusion or proximal hemodynamically significant stenosis. 2. No evidence of acute intracranial abnormality. Electronically Signed   By: Feliberto Harts M.D.   On: 09/17/2023 12:37    Micro Results    No results found for this or any previous visit (from the past 240 hour(s)).  Today   Subjective    Iban Sobel today has no headache,no chest abdominal pain,no new weakness tingling or numbness, feels much better wants to go home today.    Objective   Blood pressure 109/77, pulse (!) 50, temperature 97.9 F (36.6 C), temperature source Oral, resp. rate 16, height 5\' 10"  (1.778 m), weight 68 kg, SpO2 95%.  No intake or output data in  the 24 hours ending 09/18/23 0950  Exam  Awake Alert, No new F.N deficits, mild nystagmus on right lateral gaze [place].AT,PERRAL Supple Neck,   Symmetrical Chest wall movement, Good air movement bilaterally, CTAB RRR,No Gallops,   +ve B.Sounds, Abd Soft, Non tender,  No Cyanosis, Clubbing or edema    Data Review   Recent Labs  Lab 09/17/23 1038  WBC 9.3  HGB 14.7  HCT 44.3  PLT 221  MCV 90.2  MCH 29.9  MCHC 33.2  RDW 13.3  LYMPHSABS 2.0  MONOABS 0.6  EOSABS 0.2  BASOSABS 0.1    Recent Labs  Lab 09/17/23 1038 09/18/23 0419  NA 142 139  K 4.3 4.2  CL 106 106  CO2 26 24  ANIONGAP 10 9  GLUCOSE 95 90  BUN 12 10  CREATININE 1.11 0.99  AST 25  --   ALT 27  --   ALKPHOS 90  --   BILITOT 0.9  --   ALBUMIN 4.2  --   INR 1.0  --   HGBA1C  --  5.8*  CALCIUM 9.4 9.0    Total Time in preparing paper work, data evaluation and todays exam - 35 minutes  Signature  -    Susa Raring M.D on 09/18/2023 at 9:50 AM   -  To page go to [email or web address]

## 2023-09-18 NOTE — Discharge Instructions (Signed)
Follow with Primary MD Jerrell Mylar, PA-C in 7 days   Get CBC, CMP, 2 view Chest X ray -  checked next visit with your primary MD    Activity: As tolerated with Full fall precautions use walker/cane & assistance as needed  Disposition Home   Diet: Heart Healthy    Special Instructions: If you have smoked or chewed Tobacco  in the last 2 yrs please stop smoking, stop any regular Alcohol  and or any Recreational drug use.  On your next visit with your primary care physician please Get Medicines reviewed and adjusted.  Please request your Prim.MD to go over all Hospital Tests and Procedure/Radiological results at the follow up, please get all Hospital records sent to your Prim MD by signing hospital release before you go home.  If you experience worsening of your admission symptoms, develop shortness of breath, life threatening emergency, suicidal or homicidal thoughts you must seek medical attention immediately by calling 911 or calling your MD immediately  if symptoms less severe.  You Must read complete instructions/literature along with all the possible adverse reactions/side effects for all the Medicines you take and that have been prescribed to you. Take any new Medicines after you have completely understood and accpet all the possible adverse reactions/side effects.   Do not drive when taking Pain medications.  Do not take more than prescribed Pain, Sleep and Anxiety Medications

## 2023-09-21 ENCOUNTER — Ambulatory Visit (HOSPITAL_COMMUNITY): Payer: Self-pay

## 2023-09-21 ENCOUNTER — Telehealth (HOSPITAL_COMMUNITY): Payer: Self-pay | Admitting: Licensed Clinical Social Worker

## 2023-09-21 ENCOUNTER — Other Ambulatory Visit (HOSPITAL_COMMUNITY): Payer: Self-pay

## 2023-09-21 NOTE — Telephone Encounter (Signed)
Cln called to f/u on email pt sent stating he has been discharged and would like to do PHP. Pt reports he was advised to use new insurance and is ready to start asap. Cln advises that he cannot start until his new policy goes into effect, so the earliest he could start PHP is 12/2. Pt agrees to start 12/2 and to send cln completed intake paperwork. Cln also advised that if he has not received his new insurance card by end of day on 11/26 (due to holiday), he will need to call the [employer] to obtain the provider line phone number and his member ID so that his prior authorization can be completed on 12/2. Pt verbalized understanding and stated he wrote this down and cln agreed to email this instruction as well. Cln also recommends pt verify insurance coverage for PHP.

## 2023-09-22 ENCOUNTER — Ambulatory Visit (HOSPITAL_COMMUNITY): Payer: Self-pay

## 2023-09-22 ENCOUNTER — Other Ambulatory Visit (HOSPITAL_COMMUNITY): Payer: Self-pay

## 2023-09-23 ENCOUNTER — Other Ambulatory Visit (HOSPITAL_COMMUNITY): Payer: Self-pay

## 2023-09-23 ENCOUNTER — Ambulatory Visit (HOSPITAL_COMMUNITY): Payer: Self-pay

## 2023-09-24 ENCOUNTER — Other Ambulatory Visit (HOSPITAL_COMMUNITY): Payer: Self-pay

## 2023-09-25 ENCOUNTER — Other Ambulatory Visit (HOSPITAL_COMMUNITY): Payer: Self-pay

## 2023-09-25 ENCOUNTER — Ambulatory Visit (HOSPITAL_COMMUNITY): Payer: Self-pay

## 2023-09-28 ENCOUNTER — Ambulatory Visit (HOSPITAL_COMMUNITY): Payer: Self-pay

## 2023-09-28 ENCOUNTER — Telehealth (HOSPITAL_COMMUNITY): Payer: Self-pay | Admitting: Licensed Clinical Social Worker

## 2023-09-28 ENCOUNTER — Other Ambulatory Visit (HOSPITAL_COMMUNITY): Payer: Self-pay

## 2023-09-29 ENCOUNTER — Ambulatory Visit (HOSPITAL_COMMUNITY): Payer: Self-pay

## 2023-09-29 ENCOUNTER — Other Ambulatory Visit (HOSPITAL_COMMUNITY): Payer: PRIVATE HEALTH INSURANCE | Attending: Psychiatry

## 2023-09-29 ENCOUNTER — Other Ambulatory Visit (HOSPITAL_COMMUNITY): Payer: PRIVATE HEALTH INSURANCE | Attending: Family | Admitting: Licensed Clinical Social Worker

## 2023-09-29 ENCOUNTER — Other Ambulatory Visit (HOSPITAL_COMMUNITY): Payer: Self-pay

## 2023-09-29 ENCOUNTER — Encounter (HOSPITAL_COMMUNITY): Payer: Self-pay | Admitting: Family

## 2023-09-29 DIAGNOSIS — F332 Major depressive disorder, recurrent severe without psychotic features: Secondary | ICD-10-CM | POA: Insufficient documentation

## 2023-09-29 DIAGNOSIS — Z8673 Personal history of transient ischemic attack (TIA), and cerebral infarction without residual deficits: Secondary | ICD-10-CM | POA: Insufficient documentation

## 2023-09-29 DIAGNOSIS — N400 Enlarged prostate without lower urinary tract symptoms: Secondary | ICD-10-CM | POA: Insufficient documentation

## 2023-09-29 DIAGNOSIS — R4589 Other symptoms and signs involving emotional state: Secondary | ICD-10-CM | POA: Insufficient documentation

## 2023-09-29 DIAGNOSIS — G8929 Other chronic pain: Secondary | ICD-10-CM | POA: Insufficient documentation

## 2023-09-29 DIAGNOSIS — F17210 Nicotine dependence, cigarettes, uncomplicated: Secondary | ICD-10-CM | POA: Insufficient documentation

## 2023-09-29 DIAGNOSIS — F419 Anxiety disorder, unspecified: Secondary | ICD-10-CM | POA: Insufficient documentation

## 2023-09-29 NOTE — Progress Notes (Addendum)
Virtual Visit via Video Note  I connected with Derek Burns on 09/29/23 at  9:00 AM EST by a video enabled telemedicine application and verified that I am speaking with the correct person using two identifiers.  Location: Patient: Home Provider: Office   I discussed the limitations of evaluation and management by telemedicine and the availability of in person appointments. The patient expressed understanding and agreed to proceed.  I discussed the assessment and treatment plan with the patient. The patient was provided an opportunity to ask questions and all were answered. The patient agreed with the plan and demonstrated an understanding of the instructions.   The patient was advised to call back or seek an in-person evaluation if the symptoms worsen or if the condition fails to improve as anticipated.  I provided 00 minutes of non-face-to-face time during this encounter.   Derek Rack, NP    Psychiatric Initial Adult Assessment   Patient Identification: Derek Burns MRN:  [id number] Date of Evaluation:  09/29/2023 Referral Source: MD Derek Burns, PCP Chief Complaint:  " I am in a lot of pain, my doctor thought it would be best if I see somebody regarding my depression."  Visit Diagnosis:    ICD-10-CM   1. Severe episode of recurrent major depressive disorder, without psychotic features (HCC)  F33.2       History of Present Illness: Derek Burns 57 year old Caucasian male seen and evaluated via video teleassessment.  He presents to start partial hospitalization programming.  Reports he has been struggling with pain for the past 5+ years.  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  He denied previous inpatient admissions.  Denied that he is ever been followed by therapy or psychiatry services.  Reports he is currently prescribed Cymbalta and gabapentin to help with his pain.  He denied suicidal  or homicidal ideations.  Denies auditory or visual hallucinations.  He does admit to making statements out of excessive agony.  States" I am very spiritual and I love the Derek Burns I would never harm myself."  [name] states he is married states his wife is supportive.  States they have 1 son who is 49 years old.  He reports he continues to work at [employer], states he is attempting to apply for disability.  Through veterans affair.  Reports his first initial encounter related to back problems was in 1980s.  States his symptoms have been progressively getting worse.  Reports possible stroke few weeks prior.   Denied illicit drug use or substance abuse history.  Kell reports his symptoms include poor concentration, mood irritability, depressed mood, rumination,  sleep disturbance and increased anxiety.  He attributes his mood to chronic back pain.  Rating his depression 6 out of 10 with 10 being the worst.  Patient is currently prescribed Cymbalta and gabapentin.  patient to start partial hospitalization program (PHP) 09/29/2023  During evaluation Derek Burns is sitting ; he is alert/oriented x 4; calm/cooperative; and mood congruent with affect.  Patient is speaking in a clear tone at moderate volume, and normal pace; with good eye contact.His thought process is coherent and relevant; There is no indication that he is currently responding to internal/external stimuli or experiencing delusional thought content.  Patient denies suicidal/self-harm/homicidal ideation, psychosis, and paranoia.  Patient has remained calm throughout assessment and has answered questions appropriately.    Associated Signs/Symptoms: Depression Symptoms:  depressed mood, difficulty concentrating, anxiety, (Hypo) Manic Symptoms:  Irritable Mood, Labiality of Mood, Anxiety Symptoms:  Excessive Worry, Psychotic Symptoms:  Hallucinations: None PTSD Symptoms: NA  Past Psychiatric History:   Previous Psychotropic  Medications: No   Substance Abuse History in the last 12 months:  No.  Consequences of Substance Abuse: NA  Past Medical History:  Past Medical History:  Diagnosis Date   Complication of anesthesia    hard to wake up, low resting heart rate   DDD (degenerative disc disease), cervical    High cholesterol    Scoliosis     Past Surgical History:  Procedure Laterality Date   ANTERIOR CERVICAL DECOMP/DISCECTOMY FUSION N/A 05/03/2021   Procedure: C4-5, C5-6, C6-7 ANTERIOR CERVICAL DISCECTOMY FUSION, ALLOGRAFT AND PLATE;  Surgeon: Derek Manges, MD;  Location: [facility] OR;  Service: Orthopedics;  Laterality: N/A;   HERNIA REPAIR     ROTATOR CUFF REPAIR Left     Family Psychiatric History:   Family History: No family history on file.  Social History:   Social History   Socioeconomic History   Marital status: Married    Spouse name: Not on file   Number of children: Not on file   Years of education: Not on file   Highest education level: Not on file  Occupational History   Not on file  Tobacco Use   Smoking status: Every Day    Current packs/day: 0.50    Types: Cigarettes   Smokeless tobacco: Never  Vaping Use   Vaping status: Never Used  Substance and Sexual Activity   Alcohol use: Yes    Comment: socially   Drug use: Yes    Types: Marijuana   Sexual activity: Not on file  Other Topics Concern   Not on file  Social History Narrative   Not on file   Social Determinants of Health   Financial Resource Strain: Not on file  Food Insecurity: Low Risk  (09/23/2023)   Received from [facility]   Hunger Vital Sign    Worried About Running Out of Food in the Last Year: Never true    Ran Out of Food in the Last Year: Never true  Transportation Needs: No Transportation Needs (09/23/2023)   Received from [facility]    In the past 12 months, has lack of reliable transportation kept you from medical appointments, meetings, work or from getting things needed  for daily living? : No  Physical Activity: Not on file  Stress: Not on file  Social Connections: Not on file    Additional Social History:   Allergies:  No Known Allergies  Metabolic Disorder Labs: Lab Results  Component Value Date   HGBA1C 5.8 (H) 09/18/2023   MPG 119.76 09/18/2023   No results found for: "PROLACTIN" Lab Results  Component Value Date   CHOL 195 09/18/2023   TRIG 136 09/18/2023   HDL 33 (L) 09/18/2023   CHOLHDL 5.9 09/18/2023   VLDL 27 09/18/2023   LDLCALC 135 (H) 09/18/2023   No results found for: "TSH"  Therapeutic Level Labs: No results found for: "LITHIUM" No results found for: "CBMZ" No results found for: "VALPROATE"  Current Medications: Current Outpatient Medications  Medication Sig Dispense Refill   acetaminophen (TYLENOL) 500 MG tablet Take 500-1,000 mg by mouth every 6 (six) hours as needed for moderate pain (pain score 4-6).     DULoxetine (CYMBALTA) 30 MG capsule Take 30 mg by mouth daily.     finasteride (PROSCAR) 5 MG tablet Take 5 mg by mouth daily.  gabapentin (NEURONTIN) 300 MG capsule Take 300 mg by mouth at bedtime.     meclizine (ANTIVERT) 25 MG tablet Take 1 tablet (25 mg total) by mouth 3 (three) times daily as needed for dizziness. 30 tablet 0   Multiple Vitamin (MULTIVITAMIN WITH MINERALS) TABS tablet Take 1 tablet by mouth daily.     rosuvastatin (CRESTOR) 20 MG tablet Take 1 tablet (20 mg total) by mouth daily. 30 tablet 0   tamsulosin (FLOMAX) 0.4 MG CAPS capsule Take 0.4 mg by mouth daily.     No current facility-administered medications for this visit.    Musculoskeletal: Strength & Muscle Tone: within normal limits Gait & Station: normal Patient leans: N/A  Psychiatric Specialty Exam: Review of Systems  Musculoskeletal:  Positive for back pain and neck pain.  Psychiatric/Behavioral:  Negative for behavioral problems and suicidal ideas. The patient is nervous/anxious.   All other systems reviewed and are  negative.   There were no vitals taken for this visit.There is no height or weight on file to calculate BMI.  General Appearance: Casual  Eye Contact:  Good  Speech:  Clear and Coherent  Volume:  Normal  Mood:  Depressed  Affect:  Congruent  Thought Process:  Coherent  Orientation:  Full (Time, Place, and Person)  Thought Content:  Logical  Suicidal Thoughts:  No  Homicidal Thoughts:  No  Memory:  Immediate;   Good Recent;   Good  Judgement:  Good  Insight:  Good  Psychomotor Activity:  Normal  Concentration:  Concentration: Good  Recall:  Good  Fund of Knowledge:Good  Language: Good  Akathisia:  NA  Handed:  Right  AIMS (if indicated):  not done  Assets:  Communication Skills Desire for Improvement Social Support  ADL's:  Intact  Cognition: WNL  Sleep:  Fair   Screenings: Insurance account manager from 09/11/2023 [facility]  PHQ-2 Total Score 3  PHQ-9 Total Score 20      Flowsheet Row ED to Hosp-Admission (Discharged) from 09/17/2023 in [facility] Counselor from 09/11/2023 in [facility] Admission (Discharged) from 05/03/2021 in [facility]  C-SSRS RISK CATEGORY No Risk Error: Question 6 not populated No Risk       Assessment and Plan:  Orders placed for occupational therapy Start partial hospitalization programming Continue medications as directed Cymbalta and gabapentin -Keep all outpatient follow-up appointments work pain management and orthopedics.  Collaboration of Care: Medication Management AEB Cymbalta and gabapentin  patient enrolled in Partial Hospitalization Program, patient's current medications are to be continued, the following medications are being continued a comprehensive treatment plan will be developed and side effects of medications have been reviewed with patient  Treatment options and  alternatives reviewed with patient and patient understands the above plan. Treatment plan was reviewed and agreed upon by NP T.Melvyn Neth and patient Wes Hoyer need for group services.  Patient/Guardian was advised Release of Information must be obtained prior to any record release in order to collaborate their care with an outside provider. Patient/Guardian was advised if they have not already done so to contact the registration department to sign all necessary forms in order for [place] to release information regarding their care.   Consent: Patient/Guardian gives verbal consent for treatment and assignment of benefits for services provided during this visit. Patient/Guardian expressed understanding and agreed to proceed.   Derek Rack, NP 12/3/20242:34 PM

## 2023-09-30 ENCOUNTER — Other Ambulatory Visit (HOSPITAL_COMMUNITY): Payer: Self-pay | Admitting: Licensed Clinical Social Worker

## 2023-09-30 ENCOUNTER — Ambulatory Visit (HOSPITAL_COMMUNITY): Payer: Self-pay

## 2023-09-30 ENCOUNTER — Encounter (HOSPITAL_COMMUNITY): Payer: Self-pay

## 2023-09-30 ENCOUNTER — Other Ambulatory Visit (HOSPITAL_COMMUNITY): Payer: PRIVATE HEALTH INSURANCE

## 2023-09-30 ENCOUNTER — Other Ambulatory Visit (HOSPITAL_COMMUNITY): Payer: Self-pay

## 2023-09-30 DIAGNOSIS — F332 Major depressive disorder, recurrent severe without psychotic features: Secondary | ICD-10-CM

## 2023-09-30 DIAGNOSIS — R4589 Other symptoms and signs involving emotional state: Secondary | ICD-10-CM

## 2023-09-30 NOTE — Progress Notes (Signed)
Spoke with patient via Teams video call, used 2 identifiers to correctly identify patient. States that yesterday was his first day in [facility] as referred by his PCP. He had made some suicidal statements to his wife about wanting to die. States he is in constant pain from an old injury when in the [employer] and he made that statement with no intent or plan. Just feels it would be better to be dead than live with constant pain. He is very religious and states he would never end his own life for fear of going to hell. While he was in the [employer] he and a another man were carrying a very large log/tree and his partner dropped his end causing an injury to his back. He has had surgery to repair it but it continues to cause him pain and problems. Denies SI/HI or AV hallucinations. On scale 1-10 as 10 being worst he rates depression at 3 and anxiety at 0. PHQ9=14. No side effects from medication. No issues or complaints.

## 2023-10-01 ENCOUNTER — Other Ambulatory Visit (HOSPITAL_COMMUNITY): Payer: PRIVATE HEALTH INSURANCE

## 2023-10-01 ENCOUNTER — Other Ambulatory Visit (HOSPITAL_COMMUNITY): Payer: Self-pay

## 2023-10-01 ENCOUNTER — Ambulatory Visit (HOSPITAL_COMMUNITY): Payer: Self-pay

## 2023-10-01 ENCOUNTER — Other Ambulatory Visit (HOSPITAL_COMMUNITY): Payer: Self-pay | Admitting: Licensed Clinical Social Worker

## 2023-10-01 DIAGNOSIS — R4589 Other symptoms and signs involving emotional state: Secondary | ICD-10-CM

## 2023-10-01 DIAGNOSIS — F332 Major depressive disorder, recurrent severe without psychotic features: Secondary | ICD-10-CM

## 2023-10-02 ENCOUNTER — Ambulatory Visit (HOSPITAL_COMMUNITY): Payer: Self-pay

## 2023-10-02 ENCOUNTER — Encounter (HOSPITAL_COMMUNITY): Payer: Self-pay

## 2023-10-02 ENCOUNTER — Other Ambulatory Visit (HOSPITAL_COMMUNITY): Payer: Self-pay

## 2023-10-02 ENCOUNTER — Other Ambulatory Visit (HOSPITAL_COMMUNITY): Payer: PRIVATE HEALTH INSURANCE

## 2023-10-02 ENCOUNTER — Other Ambulatory Visit (HOSPITAL_COMMUNITY): Payer: Self-pay | Admitting: Licensed Clinical Social Worker

## 2023-10-02 DIAGNOSIS — F332 Major depressive disorder, recurrent severe without psychotic features: Secondary | ICD-10-CM

## 2023-10-02 DIAGNOSIS — R4589 Other symptoms and signs involving emotional state: Secondary | ICD-10-CM

## 2023-10-02 NOTE — Therapy (Signed)
[facility] PARTIAL HOSPITALIZATION PROGRAM [address] [place], [place], [address] Phone: [phone]   Fax:  [phone]  Occupational Therapy Treatment Virtual Visit via Video Note  I connected with Derek Burns on 10/02/23 at  8:00 AM EST by a video enabled telemedicine application and verified that I am speaking with the correct person using two identifiers.  Location: Patient: home Provider: office   I discussed the limitations of evaluation and management by telemedicine and the availability of in person appointments. The patient expressed understanding and agreed to proceed.    The patient was advised to call back or seek an in-person evaluation if the symptoms worsen or if the condition fails to improve as anticipated.  I provided 55 minutes of non-face-to-face time during this encounter.   Patient Details  Name: Derek Burns MRN: [id number] Date of Birth: 1966-02-23 No data recorded  Encounter Date: 09/30/2023   OT End of Session - 10/02/23 1014     Visit Number 2    Number of Visits 20    Date for OT Re-Evaluation 11/01/23    OT Start Time 1200    OT Stop Time 1255    OT Time Calculation (min) 55 min             Past Medical History:  Diagnosis Date   Complication of anesthesia    hard to wake up, low resting heart rate   DDD (degenerative disc disease), cervical    High cholesterol    Scoliosis     Past Surgical History:  Procedure Laterality Date   ANTERIOR CERVICAL DECOMP/DISCECTOMY FUSION N/A 05/03/2021   Procedure: C4-5, C5-6, C6-7 ANTERIOR CERVICAL DISCECTOMY FUSION, ALLOGRAFT AND PLATE;  Surgeon: Eldred Manges, MD;  Location: [facility] OR;  Service: Orthopedics;  Laterality: N/A;   HERNIA REPAIR     ROTATOR CUFF REPAIR Left     There were no vitals filed for this visit.   Subjective Assessment - 10/02/23 1013     Currently in Pain? No/denies    Pain Score 0-No pain                   Group  Session:  S: Feeling better today.   O: During the group therapy session, the occupational therapist discussed the impact of sleep disturbances on daily activities and overall health and wellbeing.   The OT also reviewed various types of sleep disorders, including insomnia, sleep apnea, restless leg syndrome, and narcolepsy, and their associated symptoms. Strategies for managing and treating sleep disturbances were also discussed, such as establishing a consistent sleep routine, avoiding stimulants before bedtime, and engaging in relaxation techniques.   Today's group also included information on how sleep disturbances can cause fatigue, mood changes, cognitive impairment, and physical health problems, and emphasizes the importance of seeking prompt treatment to maintain overall health and wellbeing.   A: In today's session, the patient demonstrated active engagement with the topic of The Importance of Sleep. They eagerly asked questions, contributed personal experiences, and showcased a noticeable eagerness to apply the discussed principles. Their participation indicated not only a strong understanding of the subject matter but also an intrinsic motivation to implement better sleep practices in their daily routine. Based on their proactive involvement, it is assessed that the patient greatly benefited from today's treatment and will likely make efforts to incorporate the insights gained.   P: Continue to attend PHP OT group sessions 5x week for 4 weeks to promote daily  structure, social engagement, and opportunities to develop and utilize adaptive strategies to maximize functional performance in preparation for safe transition and integration back into school, work, and the community. Plan to address topic of sleep in next OT group session.                OT Education - 10/02/23 1013     Education Details Sleep              OT Short Term Goals - 10/02/23 1006       OT SHORT  TERM GOAL #1   Title Patient will be educated on strategies to improve psychosocial skills needed to participate fully in all daily, work, and leisure activities.    Time 4    Period Weeks    Status On-going    Target Date 11/01/23      OT SHORT TERM GOAL #2   Title Pt will apply psychosocial skills and coping mechanisms to daily activities in order to function independently and reintegrate into community dwelling    Status On-going      OT SHORT TERM GOAL #3   Title Pt will choose and/or engage in 1-3 socially engaging leisure activities to improve social participation skills upon reintegrating into community    Status On-going                      Plan - 10/02/23 1014     Psychosocial Skills Habits;Coping Strategies;Interpersonal Interaction;Routines and Behaviors             Patient will benefit from skilled therapeutic intervention in order to improve the following deficits and impairments:       Psychosocial Skills: Habits, Coping Strategies, Interpersonal Interaction, Routines and Behaviors   Visit Diagnosis: Difficulty coping    Problem List Patient Active Problem List   Diagnosis Date Noted   Dizziness 09/17/2023   Chronic radicular lumbar pain 09/17/2023   BPH (benign prostatic hyperplasia) 09/17/2023   Tobacco use 09/17/2023   MDD (major depressive disorder), recurrent episode, severe (HCC) 09/11/2023   S/P cervical spinal fusion 05/10/2021   Cervical spinal stenosis 05/03/2021   Spinal stenosis of cervical region 02/11/2021    Ted Mcalpine, OT 10/02/2023, 10:14 AM  Kerrin Champagne, OT   [facility] HOSPITALIZATION PROGRAM [address] [place], [place], [address] Phone: [phone]   Fax:  [phone]  Name: Derek Burns MRN: [id number] Date of Birth: Jan 31, 1966

## 2023-10-02 NOTE — Therapy (Signed)
[facility] PARTIAL HOSPITALIZATION PROGRAM [address] [place], [place], [address] Phone: [phone]   Fax:  [phone]  Occupational Therapy Evaluation Virtual Visit via Video Note  I connected with Derek Burns on 10/02/23 at  8:00 AM EST by a video enabled telemedicine application and verified that I am speaking with the correct person using two identifiers.  Location: Patient: home Provider: office   I discussed the limitations of evaluation and management by telemedicine and the availability of in person appointments. The patient expressed understanding and agreed to proceed.    The patient was advised to call back or seek an in-person evaluation if the symptoms worsen or if the condition fails to improve as anticipated.  I provided 85 minutes of non-face-to-face time during this encounter.   Patient Details  Name: Derek Burns MRN: [id number] Date of Birth: 1966-04-02 No data recorded  Encounter Date: 09/29/2023   OT End of Session - 10/02/23 1004     Visit Number 1    Number of Visits 20    Date for OT Re-Evaluation 11/01/23    OT Start Time 0930    OT Stop Time 1255   Eval: 30; Tx: 55   OT Time Calculation (min) 205 min    Activity Tolerance Patient tolerated treatment well    Behavior During Therapy WFL for tasks assessed/performed             Past Medical History:  Diagnosis Date   Complication of anesthesia    hard to wake up, low resting heart rate   DDD (degenerative disc disease), cervical    High cholesterol    Scoliosis     Past Surgical History:  Procedure Laterality Date   ANTERIOR CERVICAL DECOMP/DISCECTOMY FUSION N/A 05/03/2021   Procedure: C4-5, C5-6, C6-7 ANTERIOR CERVICAL DISCECTOMY FUSION, ALLOGRAFT AND PLATE;  Surgeon: Eldred Manges, MD;  Location: [facility] OR;  Service: Orthopedics;  Laterality: N/A;   HERNIA REPAIR     ROTATOR CUFF REPAIR Left     There were no vitals filed for this visit.    Subjective Assessment - 10/02/23 1002     Subjective  I would like to learn new coping skills and habits while I am here.    Pertinent History MDD    Patient Stated Goals Improve and learn new coping skills    Currently in Pain? No/denies    Pain Score 0-No pain    Multiple Pain Sites No                  OT Assessment  OCAIRS Mental Health Interview Summary of Client Scores:  Facilitates participation in occupation Allows participation in occupation Inhibits participation in occupation Restricts participation in occupation Comments:  Roles  X     Habits   X    Personal Causation   X    Values   X    Interests   X    Skills   X    Short-Term Goals  X     Long-term Goals  X     Interpretation of Past Experiences   X    Physical Environment  X     Social Environment  X     Readiness for Change   X      Need for Occupational Therapy:  4 Shows positive occupational participation, no need for OT.   3 Need for minimal intervention/consultative participation   2 Need for OT intervention indicated to  restore/improve participation   1 Need for extensive OT intervention indicated to improve participation.  Referral for follow up services also recommended.   Assessment:  Patient demonstrates behavior that INHIBITS participation in occupation.  Patient will benefit from occupational therapy intervention in order to improve time management, financial management, stress management, job readiness skills, social skills, and health management skills in preparation to return to full time community living and to be a productive community member.    Plan:  Patient will participate in skilled occupational therapy sessions individually or in a group setting to improve coping skills, psychosocial skills, and emotional skills required to return to prior level of function. Treatment will be 4-5 times per week for 4 weeks.    Group Session:   O: During today's OT group session, the patient  participated in an educational segment about the importance of goal-setting and the application of the SMART framework to enhance daily life, particularly focusing on ADLs and iADLs. The session began with five open-ended pre-session questions that facilitated group discussion and introspection about their current relationship with goals. Following the introduction and educational segment, participants engaged in brainstorming and group discussions to devise hypothetical SMART goals. The session concluded with five post-session questions to reinforce understanding and facilitate reflection. Throughout the session, there was a range of engagement levels noted among the participants.   A:  Patient demonstrated a high level of engagement throughout the session. They actively participated in discussions, sharing personal experiences related to goal setting and challenges faced. Patient was able to clearly articulate an understanding of the SMART framework and proposed personal SMART goals related to their own ADLs with minimal assistance. They expressed enthusiasm about applying what they learned to their daily routine and appeared motivated to make changes.                   OT Education - 10/02/23 1003     Education Details OCAIRS / Tx: SMART Goals    Person(s) Educated Patient    Methods Explanation;Handout    Comprehension Verbalized understanding              OT Short Term Goals - 10/02/23 1006       OT SHORT TERM GOAL #1   Title Patient will be educated on strategies to improve psychosocial skills needed to participate fully in all daily, work, and leisure activities.    Time 4    Period Weeks    Status On-going    Target Date 11/01/23      OT SHORT TERM GOAL #2   Title Pt will apply psychosocial skills and coping mechanisms to daily activities in order to function independently and reintegrate into community dwelling    Status On-going      OT SHORT TERM GOAL #3    Title Pt will choose and/or engage in 1-3 socially engaging leisure activities to improve social participation skills upon reintegrating into community    Status On-going                      Plan - 10/02/23 1005     Clinical Impression Statement Pt presents w/ deficits across multiple psychosocial domains that inhibit daily occupational performance.    OT Occupational Profile and History Problem Focused Assessment - Including review of records relating to presenting problem    Occupational performance deficits (Please refer to evaluation for details): Rest and Sleep;Leisure;Social Participation    Psychosocial Skills Habits;Coping Strategies;Interpersonal Interaction;Routines and Behaviors  Rehab Potential Good    Clinical Decision Making Limited treatment options, no task modification necessary    Comorbidities Affecting Occupational Performance: None    Modification or Assistance to Complete Evaluation  No modification of tasks or assist necessary to complete eval    OT Frequency 5x / week    OT Duration 4 weeks    OT Treatment/Interventions Psychosocial skills training;Coping strategies training    Consulted and Agree with Plan of Care Patient             Patient will benefit from skilled therapeutic intervention in order to improve the following deficits and impairments:       Psychosocial Skills: Habits, Coping Strategies, Interpersonal Interaction, Routines and Behaviors   Visit Diagnosis: Difficulty coping  Severe episode of recurrent major depressive disorder, without psychotic features [facility])    Problem List Patient Active Problem List   Diagnosis Date Noted   Dizziness 09/17/2023   Chronic radicular lumbar pain 09/17/2023   BPH (benign prostatic hyperplasia) 09/17/2023   Tobacco use 09/17/2023   MDD (major depressive disorder), recurrent episode, severe (HCC) 09/11/2023   S/P cervical spinal fusion 05/10/2021   Cervical spinal stenosis  05/03/2021   Spinal stenosis of cervical region 02/11/2021    Ted Mcalpine, OT 10/02/2023, 10:08 AM  Kerrin Champagne, OT   [facility] HOSPITALIZATION PROGRAM [address] [place], [place], [address] Phone: [phone]   Fax:  [phone]  Name: Derek Burns MRN: [id number] Date of Birth: Apr 13, 1966

## 2023-10-05 ENCOUNTER — Ambulatory Visit (HOSPITAL_COMMUNITY): Payer: Self-pay

## 2023-10-05 ENCOUNTER — Other Ambulatory Visit (HOSPITAL_COMMUNITY): Payer: PRIVATE HEALTH INSURANCE | Admitting: Licensed Clinical Social Worker

## 2023-10-05 ENCOUNTER — Other Ambulatory Visit (HOSPITAL_COMMUNITY): Payer: PRIVATE HEALTH INSURANCE

## 2023-10-05 ENCOUNTER — Encounter (HOSPITAL_COMMUNITY): Payer: Self-pay

## 2023-10-05 ENCOUNTER — Other Ambulatory Visit (HOSPITAL_COMMUNITY): Payer: Self-pay

## 2023-10-05 DIAGNOSIS — R4589 Other symptoms and signs involving emotional state: Secondary | ICD-10-CM

## 2023-10-05 DIAGNOSIS — F332 Major depressive disorder, recurrent severe without psychotic features: Secondary | ICD-10-CM

## 2023-10-05 NOTE — Therapy (Signed)
[facility] PARTIAL HOSPITALIZATION PROGRAM [address] [place], [place], [address] Phone: [phone]   Fax:  [phone]  Occupational Therapy Treatment Virtual Visit via Video Note  I connected with Derek Burns on 10/05/23 at  8:00 AM EST by a video enabled telemedicine application and verified that I am speaking with the correct person using two identifiers.  Location: Patient: home Provider: office   I discussed the limitations of evaluation and management by telemedicine and the availability of in person appointments. The patient expressed understanding and agreed to proceed.    The patient was advised to call back or seek an in-person evaluation if the symptoms worsen or if the condition fails to improve as anticipated.  I provided 55 minutes of non-face-to-face time during this encounter.   Patient Details  Name: Derek Burns MRN: [id number] Date of Birth: [birth year] No data recorded  Encounter Date: 10/01/2023   OT End of Session - 10/05/23 2132     Visit Number 3    Number of Visits 20    Date for OT Re-Evaluation 11/01/23    OT Start Time 1200    OT Stop Time 1255    OT Time Calculation (min) 55 min             Past Medical History:  Diagnosis Date   Complication of anesthesia    hard to wake up, low resting heart rate   DDD (degenerative disc disease), cervical    High cholesterol    Scoliosis     Past Surgical History:  Procedure Laterality Date   ANTERIOR CERVICAL DECOMP/DISCECTOMY FUSION N/A 05/03/2021   Procedure: C4-5, C5-6, C6-7 ANTERIOR CERVICAL DISCECTOMY FUSION, ALLOGRAFT AND PLATE;  Surgeon: Eldred Manges, MD;  Location: [facility] OR;  Service: Orthopedics;  Laterality: N/A;   HERNIA REPAIR     ROTATOR CUFF REPAIR Left     There were no vitals filed for this visit.   Subjective Assessment - 10/05/23 2131     Currently in Pain? No/denies    Pain Score 0-No pain                 Group  Session:  S: Doing better today.  O: During the group therapy session, the occupational therapist discussed the impact of sleep disturbances on daily activities and overall health and wellbeing.   The OT also reviewed various types of sleep disorders, including insomnia, sleep apnea, restless leg syndrome, and narcolepsy, and their associated symptoms. Strategies for managing and treating sleep disturbances were also discussed, such as establishing a consistent sleep routine, avoiding stimulants before bedtime, and engaging in relaxation techniques.   Today's group also included information on how sleep disturbances can cause fatigue, mood changes, cognitive impairment, and physical health problems, and emphasizes the importance of seeking prompt treatment to maintain overall health and wellbeing.   A: In today's session, the patient demonstrated active engagement with the topic of The Importance of Sleep. They eagerly asked questions, contributed personal experiences, and showcased a noticeable eagerness to apply the discussed principles. Their participation indicated not only a strong understanding of the subject matter but also an intrinsic motivation to implement better sleep practices in their daily routine. Based on their proactive involvement, it is assessed that the patient greatly benefited from today's treatment and will likely make efforts to incorporate the insights gained.    P: Continue to attend PHP OT group sessions 5x week for 4 weeks to promote daily structure, social  engagement, and opportunities to develop and utilize adaptive strategies to maximize functional performance in preparation for safe transition and integration back into school, work, and the community. Plan to address topic of tbd in next OT group session.                  OT Education - 10/05/23 2132     Education Details Sleep              OT Short Term Goals - 10/02/23 1006       OT  SHORT TERM GOAL #1   Title Patient will be educated on strategies to improve psychosocial skills needed to participate fully in all daily, work, and leisure activities.    Time 4    Period Weeks    Status On-going    Target Date 11/01/23      OT SHORT TERM GOAL #2   Title Pt will apply psychosocial skills and coping mechanisms to daily activities in order to function independently and reintegrate into community dwelling    Status On-going      OT SHORT TERM GOAL #3   Title Pt will choose and/or engage in 1-3 socially engaging leisure activities to improve social participation skills upon reintegrating into community    Status On-going                      Plan - 10/05/23 2132     Psychosocial Skills Habits;Coping Strategies;Interpersonal Interaction;Routines and Behaviors             Patient will benefit from skilled therapeutic intervention in order to improve the following deficits and impairments:       Psychosocial Skills: Habits, Coping Strategies, Interpersonal Interaction, Routines and Behaviors   Visit Diagnosis: Difficulty coping    Problem List Patient Active Problem List   Diagnosis Date Noted   Dizziness 09/17/2023   Chronic radicular lumbar pain 09/17/2023   BPH (benign prostatic hyperplasia) 09/17/2023   Tobacco use 09/17/2023   MDD (major depressive disorder), recurrent episode, severe (HCC) 09/11/2023   S/P cervical spinal fusion 05/10/2021   Cervical spinal stenosis 05/03/2021   Spinal stenosis of cervical region 02/11/2021    Ted Mcalpine, OT 10/05/2023, 9:33 PM  Kerrin Champagne, OT   [facility] HOSPITALIZATION PROGRAM [address] [place], [place], [address] Phone: [phone]   Fax:  [phone]  Name: Derek Burns MRN: [id number] Date of Birth: Dec 06, 1965

## 2023-10-05 NOTE — Progress Notes (Unsigned)
Met with patient today through virtual connection as he presented with flat affect, level and pleasant mood and stated he looked into starting PHP at the advice of his primary care provider.  Patient reported he had a chronic history of worsening back pain since initially hurting himself in the [employer] when he was 52 or 57 years old. Patient reported this had progressively worsened over the past 12 years to now he was not able to do most things he used to enjoy. Patient stated he had become depressed and although he was not having active thoughts to harm himself, knew he needed some help.  Patient stated he is now taking Cymbalta as prescribed and reviewed all other medications.  Patient stated feeling some better and feels PHP group may be helpful.  Patient stated while he is still getting up often at night, 4-5 times, he is starting to sleep better overall and up to 8-9 hours per night.  Patient stated no problems with appetite, denied any history or current auditory or visual hallucinations and denied any current suicidal or homicidal ideations with no plan, intent or means to want to harm self at this time.  Patient rated his current level of depression a 2 and anxiety a 2 on a scale of 0-10 with 10 being the worst.  Patient with no complaints currently and stated plan to follow up with his PCP who was referring him to a neurologist after a suspected "light stroke" prior to Thanksgiving.  Patient stated they were not sure this was what occurred but wanted him to follow up seeing a neurologist they were sending him to see.  Requested patient let us know if any issues setting this up or any other problems.  Patient stated taking all medications as prescribed and no symptoms, side effects or other concerns today.

## 2023-10-06 ENCOUNTER — Other Ambulatory Visit (HOSPITAL_COMMUNITY): Payer: PRIVATE HEALTH INSURANCE

## 2023-10-06 ENCOUNTER — Encounter (HOSPITAL_COMMUNITY): Payer: Self-pay

## 2023-10-06 ENCOUNTER — Ambulatory Visit (HOSPITAL_COMMUNITY): Payer: Self-pay

## 2023-10-06 ENCOUNTER — Other Ambulatory Visit (HOSPITAL_COMMUNITY): Payer: PRIVATE HEALTH INSURANCE | Admitting: Licensed Clinical Social Worker

## 2023-10-06 ENCOUNTER — Other Ambulatory Visit (HOSPITAL_COMMUNITY): Payer: Self-pay

## 2023-10-06 DIAGNOSIS — F332 Major depressive disorder, recurrent severe without psychotic features: Secondary | ICD-10-CM

## 2023-10-06 DIAGNOSIS — R4589 Other symptoms and signs involving emotional state: Secondary | ICD-10-CM

## 2023-10-06 NOTE — Therapy (Signed)
[facility] PARTIAL HOSPITALIZATION PROGRAM [address] [place], [place], [address] Phone: [phone]   Fax:  [phone]  Occupational Therapy Treatment Virtual Visit via Video Note  I connected with Derek Burns on 10/06/23 at  8:00 AM EST by a video enabled telemedicine application and verified that I am speaking with the correct person using two identifiers.  Location: Patient: home Provider: office   I discussed the limitations of evaluation and management by telemedicine and the availability of in person appointments. The patient expressed understanding and agreed to proceed.    The patient was advised to call back or seek an in-person evaluation if the symptoms worsen or if the condition fails to improve as anticipated.  I provided 55 minutes of non-face-to-face time during this encounter.   Patient Details  Name: Derek Burns MRN: [id number] Date of Birth: 09/27/1966 No data recorded  Encounter Date: 10/02/2023   OT End of Session - 10/06/23 1007     Visit Number 4    Number of Visits 20    Date for OT Re-Evaluation 11/01/23    OT Start Time 1200    OT Stop Time 1255    OT Time Calculation (min) 55 min             Past Medical History:  Diagnosis Date   Complication of anesthesia    hard to wake up, low resting heart rate   DDD (degenerative disc disease), cervical    High cholesterol    Scoliosis     Past Surgical History:  Procedure Laterality Date   ANTERIOR CERVICAL DECOMP/DISCECTOMY FUSION N/A 05/03/2021   Procedure: C4-5, C5-6, C6-7 ANTERIOR CERVICAL DISCECTOMY FUSION, ALLOGRAFT AND PLATE;  Surgeon: Eldred Manges, MD;  Location: [facility] OR;  Service: Orthopedics;  Laterality: N/A;   HERNIA REPAIR     ROTATOR CUFF REPAIR Left     There were no vitals filed for this visit.   Subjective Assessment - 10/06/23 1007     Currently in Pain? No/denies    Pain Score 0-No pain                Group  Session:  S: Doing better today.   O: The primary objective of this topic is to explore and understand the concept of occupational balance in the context of daily living. The term "occupational balance" is defined broadly, encompassing all activities that occupy an individual's time and energy, including self-care, leisure, and work-related tasks. The goal is to guide participants towards achieving a harmonious blend of these activities, tailored to their personal values and life circumstances. This balance is aimed at enhancing overall well-being, not by equally distributing time across activities, but by ensuring that daily engagements are fulfilling and not draining. The content delves into identifying various barriers that individuals face in achieving occupational balance, such as overcommitment, misaligned priorities, external pressures, and lack of effective time management. The impact of these barriers on occupational performance, roles, and lifestyles is examined, highlighting issues like reduced efficiency, strained relationships, and potential health problems. Strategies for cultivating occupational balance are a key focus. These strategies include practical methods like time blocking, prioritizing tasks, establishing self-care rituals, decluttering, connecting with nature, and engaging in reflective practices. These approaches are designed to be adaptable and applicable to a wide range of life scenarios, promoting a proactive and mindful approach to daily living. The overall aim is to equip participants with the knowledge and tools to create a balanced lifestyle  that supports their mental, emotional, and physical health, thereby improving their functional performance in daily life.   A:  The patient demonstrated a high level of engagement and active participation throughout the session on occupational balance. The patient frequently contributed to discussions, offering insightful reflections  on personal experiences related to the barriers and strategies for achieving occupational balance. There was a clear understanding of the concept and an ability to relate it to their own life. The patient showed enthusiasm in learning and applying the strategies discussed, such as time blocking and self-care rituals, indicating a strong motivation to improve their occupational balance. The patient's proactive approach and responsiveness to the topic suggest a high potential for implementing these strategies effectively in their daily routine.    P: Continue to attend PHP OT group sessions 5x week for 4 weeks to promote daily structure, social engagement, and opportunities to develop and utilize adaptive strategies to maximize functional performance in preparation for safe transition and integration back into school, work, and the community. Plan to address topic of pt 2 in next OT group session.                   OT Education - 10/06/23 1007     Education Details Occupational Balance              OT Short Term Goals - 10/02/23 1006       OT SHORT TERM GOAL #1   Title Patient will be educated on strategies to improve psychosocial skills needed to participate fully in all daily, work, and leisure activities.    Time 4    Period Weeks    Status On-going    Target Date 11/01/23      OT SHORT TERM GOAL #2   Title Pt will apply psychosocial skills and coping mechanisms to daily activities in order to function independently and reintegrate into community dwelling    Status On-going      OT SHORT TERM GOAL #3   Title Pt will choose and/or engage in 1-3 socially engaging leisure activities to improve social participation skills upon reintegrating into community    Status On-going                      Plan - 10/06/23 1008     Psychosocial Skills Habits;Coping Strategies;Interpersonal Interaction;Routines and Behaviors             Patient will benefit  from skilled therapeutic intervention in order to improve the following deficits and impairments:       Psychosocial Skills: Habits, Coping Strategies, Interpersonal Interaction, Routines and Behaviors   Visit Diagnosis: Difficulty coping    Problem List Patient Active Problem List   Diagnosis Date Noted   Dizziness 09/17/2023   Chronic radicular lumbar pain 09/17/2023   BPH (benign prostatic hyperplasia) 09/17/2023   Tobacco use 09/17/2023   MDD (major depressive disorder), recurrent episode, severe (HCC) 09/11/2023   S/P cervical spinal fusion 05/10/2021   Cervical spinal stenosis 05/03/2021   Spinal stenosis of cervical region 02/11/2021    Ted Mcalpine, OT 10/06/2023, 10:08 AM  Kerrin Champagne, OT   [facility] HOSPITALIZATION PROGRAM [address] [place], [place], [address] Phone: [phone]   Fax:  [phone]  Name: Derek Burns MRN: [id number] Date of Birth: 09/27/1966

## 2023-10-06 NOTE — Therapy (Signed)
[facility] PARTIAL HOSPITALIZATION PROGRAM [address] [place], [place], [address] Phone: [phone]   Fax:  [phone]  Occupational Therapy Treatment Virtual Visit via Video Note  I connected with Derek Burns on 10/06/23 at  8:00 AM EST by a video enabled telemedicine application and verified that I am speaking with the correct person using two identifiers.  Location: Patient: home Provider: office   I discussed the limitations of evaluation and management by telemedicine and the availability of in person appointments. The patient expressed understanding and agreed to proceed.    The patient was advised to call back or seek an in-person evaluation if the symptoms worsen or if the condition fails to improve as anticipated.  I provided 55 minutes of non-face-to-face time during this encounter.   Patient Details  Name: Derek Burns MRN: [id number] Date of Birth: 1966/04/15 No data recorded  Encounter Date: 10/05/2023   OT End of Session - 10/06/23 1024     Visit Number 5    Number of Visits 20    Date for OT Re-Evaluation 11/01/23    OT Start Time 1200    OT Stop Time 1255    OT Time Calculation (min) 55 min             Past Medical History:  Diagnosis Date   Complication of anesthesia    hard to wake up, low resting heart rate   DDD (degenerative disc disease), cervical    High cholesterol    Scoliosis     Past Surgical History:  Procedure Laterality Date   ANTERIOR CERVICAL DECOMP/DISCECTOMY FUSION N/A 05/03/2021   Procedure: C4-5, C5-6, C6-7 ANTERIOR CERVICAL DISCECTOMY FUSION, ALLOGRAFT AND PLATE;  Surgeon: Eldred Manges, MD;  Location: [facility] OR;  Service: Orthopedics;  Laterality: N/A;   HERNIA REPAIR     ROTATOR CUFF REPAIR Left     There were no vitals filed for this visit.   Subjective Assessment - 10/06/23 1024     Currently in Pain? No/denies    Pain Score 0-No pain                 Group  Session:  S: Doing good today.   O: The primary objective of this topic is to explore and understand the concept of occupational balance in the context of daily living. The term "occupational balance" is defined broadly, encompassing all activities that occupy an individual's time and energy, including self-care, leisure, and work-related tasks. The goal is to guide participants towards achieving a harmonious blend of these activities, tailored to their personal values and life circumstances. This balance is aimed at enhancing overall well-being, not by equally distributing time across activities, but by ensuring that daily engagements are fulfilling and not draining. The content delves into identifying various barriers that individuals face in achieving occupational balance, such as overcommitment, misaligned priorities, external pressures, and lack of effective time management. The impact of these barriers on occupational performance, roles, and lifestyles is examined, highlighting issues like reduced efficiency, strained relationships, and potential health problems. Strategies for cultivating occupational balance are a key focus. These strategies include practical methods like time blocking, prioritizing tasks, establishing self-care rituals, decluttering, connecting with nature, and engaging in reflective practices. These approaches are designed to be adaptable and applicable to a wide range of life scenarios, promoting a proactive and mindful approach to daily living. The overall aim is to equip participants with the knowledge and tools to create a balanced  lifestyle that supports their mental, emotional, and physical health, thereby improving their functional performance in daily life.   A:  The patient demonstrated a high level of engagement and active participation throughout the session on occupational balance. The patient frequently contributed to discussions, offering insightful reflections on  personal experiences related to the barriers and strategies for achieving occupational balance. There was a clear understanding of the concept and an ability to relate it to their own life. The patient showed enthusiasm in learning and applying the strategies discussed, such as time blocking and self-care rituals, indicating a strong motivation to improve their occupational balance. The patient's proactive approach and responsiveness to the topic suggest a high potential for implementing these strategies effectively in their daily routine.   P: Continue to attend PHP OT group sessions 5x week for 4 weeks to promote daily structure, social engagement, and opportunities to develop and utilize adaptive strategies to maximize functional performance in preparation for safe transition and integration back into school, work, and the community. Plan to address topic of tbd in next OT group session.                  OT Education - 10/06/23 1024     Education Details Occupational Balance              OT Short Term Goals - 10/02/23 1006       OT SHORT TERM GOAL #1   Title Patient will be educated on strategies to improve psychosocial skills needed to participate fully in all daily, work, and leisure activities.    Time 4    Period Weeks    Status On-going    Target Date 11/01/23      OT SHORT TERM GOAL #2   Title Pt will apply psychosocial skills and coping mechanisms to daily activities in order to function independently and reintegrate into community dwelling    Status On-going      OT SHORT TERM GOAL #3   Title Pt will choose and/or engage in 1-3 socially engaging leisure activities to improve social participation skills upon reintegrating into community    Status On-going                      Plan - 10/06/23 1024     Psychosocial Skills Habits;Coping Strategies;Interpersonal Interaction;Routines and Behaviors             Patient will benefit from  skilled therapeutic intervention in order to improve the following deficits and impairments:       Psychosocial Skills: Habits, Coping Strategies, Interpersonal Interaction, Routines and Behaviors   Visit Diagnosis: Difficulty coping    Problem List Patient Active Problem List   Diagnosis Date Noted   Dizziness 09/17/2023   Chronic radicular lumbar pain 09/17/2023   BPH (benign prostatic hyperplasia) 09/17/2023   Tobacco use 09/17/2023   MDD (major depressive disorder), recurrent episode, severe (HCC) 09/11/2023   S/P cervical spinal fusion 05/10/2021   Cervical spinal stenosis 05/03/2021   Spinal stenosis of cervical region 02/11/2021    Ted Mcalpine, OT 10/06/2023, 10:25 AM  Kerrin Champagne, OT   [facility] HOSPITALIZATION PROGRAM [address] [place], [place], [address] Phone: [phone]   Fax:  [phone]  Name: Derek Burns MRN: [id number] Date of Birth: 06-13-1966

## 2023-10-07 ENCOUNTER — Ambulatory Visit (HOSPITAL_COMMUNITY): Payer: Self-pay

## 2023-10-07 ENCOUNTER — Encounter (HOSPITAL_COMMUNITY): Payer: Self-pay

## 2023-10-07 ENCOUNTER — Other Ambulatory Visit (HOSPITAL_COMMUNITY): Payer: PRIVATE HEALTH INSURANCE

## 2023-10-07 ENCOUNTER — Other Ambulatory Visit (HOSPITAL_COMMUNITY): Payer: PRIVATE HEALTH INSURANCE | Admitting: Licensed Clinical Social Worker

## 2023-10-07 ENCOUNTER — Other Ambulatory Visit (HOSPITAL_COMMUNITY): Payer: Self-pay

## 2023-10-07 DIAGNOSIS — F332 Major depressive disorder, recurrent severe without psychotic features: Secondary | ICD-10-CM

## 2023-10-07 DIAGNOSIS — R4589 Other symptoms and signs involving emotional state: Secondary | ICD-10-CM

## 2023-10-07 NOTE — Therapy (Signed)
[facility] PARTIAL HOSPITALIZATION PROGRAM [address] [place], [place], [address] Phone: [phone]   Fax:  [phone]  Occupational Therapy Treatment Virtual Visit via Video Note  I connected with [name] on 10/07/23 at  8:00 AM EST by a video enabled telemedicine application and verified that I am speaking with the correct person using two identifiers.  Location: Patient: home Provider: office   I discussed the limitations of evaluation and management by telemedicine and the availability of in person appointments. The patient expressed understanding and agreed to proceed.    The patient was advised to call back or seek an in-person evaluation if the symptoms worsen or if the condition fails to improve as anticipated.  I provided 55 minutes of non-face-to-face time during this encounter.   Patient Details  Name: Derek Burns MRN: [id number] Date of Birth: 1966/02/09 No data recorded  Encounter Date: 10/06/2023   OT End of Session - 10/07/23 1010     Visit Number 6    Number of Visits 20    Date for OT Re-Evaluation 11/01/23    OT Start Time 1200    OT Stop Time 1255    OT Time Calculation (min) 55 min             Past Medical History:  Diagnosis Date   Complication of anesthesia    hard to wake up, low resting heart rate   DDD (degenerative disc disease), cervical    High cholesterol    Scoliosis     Past Surgical History:  Procedure Laterality Date   ANTERIOR CERVICAL DECOMP/DISCECTOMY FUSION N/A 05/03/2021   Procedure: C4-5, C5-6, C6-7 ANTERIOR CERVICAL DISCECTOMY FUSION, ALLOGRAFT AND PLATE;  Surgeon: Eldred Manges, MD;  Location: [facility] OR;  Service: Orthopedics;  Laterality: N/A;   HERNIA REPAIR     ROTATOR CUFF REPAIR Left     There were no vitals filed for this visit.   Subjective Assessment - 10/07/23 1009     Currently in Pain? No/denies    Pain Score 0-No pain               Group  Session:  S: Feeling better today.  O: The primary objective of this topic is to explore and understand the concept of occupational balance in the context of daily living. The term "occupational balance" is defined broadly, encompassing all activities that occupy an individual's time and energy, including self-care, leisure, and work-related tasks. The goal is to guide participants towards achieving a harmonious blend of these activities, tailored to their personal values and life circumstances. This balance is aimed at enhancing overall well-being, not by equally distributing time across activities, but by ensuring that daily engagements are fulfilling and not draining. The content delves into identifying various barriers that individuals face in achieving occupational balance, such as overcommitment, misaligned priorities, external pressures, and lack of effective time management. The impact of these barriers on occupational performance, roles, and lifestyles is examined, highlighting issues like reduced efficiency, strained relationships, and potential health problems. Strategies for cultivating occupational balance are a key focus. These strategies include practical methods like time blocking, prioritizing tasks, establishing self-care rituals, decluttering, connecting with nature, and engaging in reflective practices. These approaches are designed to be adaptable and applicable to a wide range of life scenarios, promoting a proactive and mindful approach to daily living. The overall aim is to equip participants with the knowledge and tools to create a balanced lifestyle that supports  their mental, emotional, and physical health, thereby improving their functional performance in daily life.   A:  The patient demonstrated a high level of engagement and active participation throughout the session on occupational balance. The patient frequently contributed to discussions, offering insightful reflections  on personal experiences related to the barriers and strategies for achieving occupational balance. There was a clear understanding of the concept and an ability to relate it to their own life. The patient showed enthusiasm in learning and applying the strategies discussed, such as time blocking and self-care rituals, indicating a strong motivation to improve their occupational balance. The patient's proactive approach and responsiveness to the topic suggest a high potential for implementing these strategies effectively in their daily routine.    P: Continue to attend PHP OT group sessions 5x week for 4 weeks to promote daily structure, social engagement, and opportunities to develop and utilize adaptive strategies to maximize functional performance in preparation for safe transition and integration back into school, work, and the community. Plan to address topic of tbd in next OT group session.                    OT Education - 10/07/23 1010     Education Details Occupational Balance              OT Short Term Goals - 10/02/23 1006       OT SHORT TERM GOAL #1   Title Patient will be educated on strategies to improve psychosocial skills needed to participate fully in all daily, work, and leisure activities.    Time 4    Period Weeks    Status On-going    Target Date 11/01/23      OT SHORT TERM GOAL #2   Title Pt will apply psychosocial skills and coping mechanisms to daily activities in order to function independently and reintegrate into community dwelling    Status On-going      OT SHORT TERM GOAL #3   Title Pt will choose and/or engage in 1-3 socially engaging leisure activities to improve social participation skills upon reintegrating into community    Status On-going                      Plan - 10/07/23 1011     Psychosocial Skills Habits;Coping Strategies;Interpersonal Interaction;Routines and Behaviors             Patient will benefit  from skilled therapeutic intervention in order to improve the following deficits and impairments:       Psychosocial Skills: Habits, Coping Strategies, Interpersonal Interaction, Routines and Behaviors   Visit Diagnosis: Difficulty coping    Problem List Patient Active Problem List   Diagnosis Date Noted   Dizziness 09/17/2023   Chronic radicular lumbar pain 09/17/2023   BPH (benign prostatic hyperplasia) 09/17/2023   Tobacco use 09/17/2023   MDD (major depressive disorder), recurrent episode, severe (HCC) 09/11/2023   S/P cervical spinal fusion 05/10/2021   Cervical spinal stenosis 05/03/2021   Spinal stenosis of cervical region 02/11/2021    Ted Mcalpine, OT 10/07/2023, 10:11 AM Kerrin Champagne, OT  [facility] HOSPITALIZATION PROGRAM [address] [place], [place], [address] Phone: [phone]   Fax:  [phone]  Name: WHEELER SANTANGELO MRN: [id number] Date of Birth: 1966/02/12

## 2023-10-07 NOTE — Psych (Incomplete)
Derek Burns is a  57 yo male seen today for follow up visit. Patient continues to participate in the PHP. He reports that PHP is going well and he is benefiting from group. He reports "decent" mood today and states that his back pain affects his mood at times. Patient reports a pain rate of 6 on a 10 point scale today. He reports being prescribed gabapentin and tylenol for pain. He has a psychiatric history of MDD, severe recurrent and is prescribed Cymbalta 30 mg daily. Patient reports that his medications are effective with managing his symptoms. He denies the need for medication adjustments today or adverse medication effects. He reports that his depression has improved since starting the PHP, stating he was really "depressed before starting". He denies SI/HI/paranoia, AVH, or delusional thought. Patient to continue on his current medication regimen and continue with the PHP.

## 2023-10-08 ENCOUNTER — Encounter (HOSPITAL_COMMUNITY): Payer: Self-pay | Admitting: Psychiatry

## 2023-10-08 ENCOUNTER — Other Ambulatory Visit (HOSPITAL_COMMUNITY): Payer: PRIVATE HEALTH INSURANCE

## 2023-10-08 ENCOUNTER — Ambulatory Visit (HOSPITAL_COMMUNITY): Payer: Self-pay

## 2023-10-08 ENCOUNTER — Other Ambulatory Visit (HOSPITAL_COMMUNITY): Payer: Self-pay

## 2023-10-08 ENCOUNTER — Other Ambulatory Visit (HOSPITAL_COMMUNITY): Payer: PRIVATE HEALTH INSURANCE | Admitting: Licensed Clinical Social Worker

## 2023-10-08 DIAGNOSIS — R4589 Other symptoms and signs involving emotional state: Secondary | ICD-10-CM

## 2023-10-08 DIAGNOSIS — F332 Major depressive disorder, recurrent severe without psychotic features: Secondary | ICD-10-CM

## 2023-10-09 ENCOUNTER — Ambulatory Visit (HOSPITAL_COMMUNITY): Payer: Self-pay

## 2023-10-09 ENCOUNTER — Other Ambulatory Visit (HOSPITAL_COMMUNITY): Payer: PRIVATE HEALTH INSURANCE

## 2023-10-09 ENCOUNTER — Other Ambulatory Visit (HOSPITAL_COMMUNITY): Payer: Self-pay

## 2023-10-09 ENCOUNTER — Other Ambulatory Visit (HOSPITAL_COMMUNITY): Payer: PRIVATE HEALTH INSURANCE | Admitting: Licensed Clinical Social Worker

## 2023-10-09 DIAGNOSIS — R4589 Other symptoms and signs involving emotional state: Secondary | ICD-10-CM

## 2023-10-09 DIAGNOSIS — F332 Major depressive disorder, recurrent severe without psychotic features: Secondary | ICD-10-CM

## 2023-10-12 ENCOUNTER — Other Ambulatory Visit (HOSPITAL_COMMUNITY): Payer: Self-pay

## 2023-10-12 ENCOUNTER — Other Ambulatory Visit (HOSPITAL_COMMUNITY): Payer: PRIVATE HEALTH INSURANCE

## 2023-10-12 ENCOUNTER — Ambulatory Visit (HOSPITAL_COMMUNITY): Payer: Self-pay

## 2023-10-12 ENCOUNTER — Encounter (HOSPITAL_COMMUNITY): Payer: Self-pay

## 2023-10-12 ENCOUNTER — Other Ambulatory Visit (HOSPITAL_COMMUNITY): Payer: PRIVATE HEALTH INSURANCE | Admitting: Licensed Clinical Social Worker

## 2023-10-12 DIAGNOSIS — F332 Major depressive disorder, recurrent severe without psychotic features: Secondary | ICD-10-CM

## 2023-10-12 DIAGNOSIS — R4589 Other symptoms and signs involving emotional state: Secondary | ICD-10-CM

## 2023-10-12 NOTE — Therapy (Signed)
[facility] PARTIAL HOSPITALIZATION PROGRAM [address] [place], [place], [address] Phone: [phone]   Fax:  [phone]  Occupational Therapy Treatment Virtual Visit via Video Note  I connected with Derek Burns on 10/12/23 at  8:00 AM EST by a video enabled telemedicine application and verified that I am speaking with the correct person using two identifiers.  Location: Patient: home Provider: office   I discussed the limitations of evaluation and management by telemedicine and the availability of in person appointments. The patient expressed understanding and agreed to proceed.    The patient was advised to call back or seek an in-person evaluation if the symptoms worsen or if the condition fails to improve as anticipated.  I provided 55 minutes of non-face-to-face time during this encounter.\  Patient Details  Name: Derek Burns MRN: [id number] Date of Birth: 01/18/1966 No data recorded  Encounter Date: 10/07/2023   OT End of Session - 10/12/23 0817     Visit Number 7    Number of Visits 20    Date for OT Re-Evaluation 11/01/23    OT Start Time 1200    OT Stop Time 1255    OT Time Calculation (min) 55 min             Past Medical History:  Diagnosis Date   Complication of anesthesia    hard to wake up, low resting heart rate   DDD (degenerative disc disease), cervical    High cholesterol    Scoliosis     Past Surgical History:  Procedure Laterality Date   ANTERIOR CERVICAL DECOMP/DISCECTOMY FUSION N/A 05/03/2021   Procedure: C4-5, C5-6, C6-7 ANTERIOR CERVICAL DISCECTOMY FUSION, ALLOGRAFT AND PLATE;  Surgeon: Eldred Manges, MD;  Location: [facility] OR;  Service: Orthopedics;  Laterality: N/A;   HERNIA REPAIR     ROTATOR CUFF REPAIR Left     There were no vitals filed for this visit.   Subjective Assessment - 10/12/23 0817     Currently in Pain? No/denies    Pain Score 0-No pain                 Group  Session:  S: Doing better today.   O: The primary objective of this topic is to explore and understand the concept of occupational balance in the context of daily living. The term "occupational balance" is defined broadly, encompassing all activities that occupy an individual's time and energy, including self-care, leisure, and work-related tasks. The goal is to guide participants towards achieving a harmonious blend of these activities, tailored to their personal values and life circumstances. This balance is aimed at enhancing overall well-being, not by equally distributing time across activities, but by ensuring that daily engagements are fulfilling and not draining. The content delves into identifying various barriers that individuals face in achieving occupational balance, such as overcommitment, misaligned priorities, external pressures, and lack of effective time management. The impact of these barriers on occupational performance, roles, and lifestyles is examined, highlighting issues like reduced efficiency, strained relationships, and potential health problems. Strategies for cultivating occupational balance are a key focus. These strategies include practical methods like time blocking, prioritizing tasks, establishing self-care rituals, decluttering, connecting with nature, and engaging in reflective practices. These approaches are designed to be adaptable and applicable to a wide range of life scenarios, promoting a proactive and mindful approach to daily living. The overall aim is to equip participants with the knowledge and tools to create a balanced lifestyle  that supports their mental, emotional, and physical health, thereby improving their functional performance in daily life.   A:  The patient demonstrated a high level of engagement and active participation throughout the session on occupational balance. The patient frequently contributed to discussions, offering insightful reflections  on personal experiences related to the barriers and strategies for achieving occupational balance. There was a clear understanding of the concept and an ability to relate it to their own life. The patient showed enthusiasm in learning and applying the strategies discussed, such as time blocking and self-care rituals, indicating a strong motivation to improve their occupational balance. The patient's proactive approach and responsiveness to the topic suggest a high potential for implementing these strategies effectively in their daily routine.    P: Continue to attend PHP OT group sessions 5x week for 4 weeks to promote daily structure, social engagement, and opportunities to develop and utilize adaptive strategies to maximize functional performance in preparation for safe transition and integration back into school, work, and the community. Plan to address topic of Motivation in next OT group session.                  OT Education - 10/12/23 0817     Education Details Occupational Balance              OT Short Term Goals - 10/02/23 1006       OT SHORT TERM GOAL #1   Title Patient will be educated on strategies to improve psychosocial skills needed to participate fully in all daily, work, and leisure activities.    Time 4    Period Weeks    Status On-going    Target Date 11/01/23      OT SHORT TERM GOAL #2   Title Pt will apply psychosocial skills and coping mechanisms to daily activities in order to function independently and reintegrate into community dwelling    Status On-going      OT SHORT TERM GOAL #3   Title Pt will choose and/or engage in 1-3 socially engaging leisure activities to improve social participation skills upon reintegrating into community    Status On-going                      Plan - 10/12/23 0818     Psychosocial Skills Habits;Coping Strategies;Interpersonal Interaction;Routines and Behaviors             Patient will  benefit from skilled therapeutic intervention in order to improve the following deficits and impairments:       Psychosocial Skills: Habits, Coping Strategies, Interpersonal Interaction, Routines and Behaviors   Visit Diagnosis: Difficulty coping    Problem List Patient Active Problem List   Diagnosis Date Noted   Dizziness 09/17/2023   Chronic radicular lumbar pain 09/17/2023   BPH (benign prostatic hyperplasia) 09/17/2023   Tobacco use 09/17/2023   MDD (major depressive disorder), recurrent episode, severe (HCC) 09/11/2023   S/P cervical spinal fusion 05/10/2021   Cervical spinal stenosis 05/03/2021   Spinal stenosis of cervical region 02/11/2021    Ted Mcalpine, OT 10/12/2023, 8:18 AM  Kerrin Champagne, OT   [facility] HOSPITALIZATION PROGRAM [address] [place], [place], [address] Phone: [phone]   Fax:  [phone]  Name: Derek Burns MRN: [id number] Date of Birth: 1965-11-02

## 2023-10-13 ENCOUNTER — Other Ambulatory Visit (HOSPITAL_COMMUNITY): Payer: Self-pay | Admitting: Licensed Clinical Social Worker

## 2023-10-13 ENCOUNTER — Other Ambulatory Visit (HOSPITAL_COMMUNITY): Payer: PRIVATE HEALTH INSURANCE

## 2023-10-13 DIAGNOSIS — F332 Major depressive disorder, recurrent severe without psychotic features: Secondary | ICD-10-CM

## 2023-10-13 DIAGNOSIS — R4589 Other symptoms and signs involving emotional state: Secondary | ICD-10-CM

## 2023-10-14 ENCOUNTER — Other Ambulatory Visit (HOSPITAL_COMMUNITY): Payer: Self-pay | Admitting: Student

## 2023-10-14 ENCOUNTER — Encounter (HOSPITAL_COMMUNITY): Payer: Self-pay

## 2023-10-14 ENCOUNTER — Other Ambulatory Visit (HOSPITAL_COMMUNITY): Payer: PRIVATE HEALTH INSURANCE | Admitting: Licensed Clinical Social Worker

## 2023-10-14 ENCOUNTER — Other Ambulatory Visit (HOSPITAL_COMMUNITY): Payer: PRIVATE HEALTH INSURANCE

## 2023-10-14 ENCOUNTER — Encounter (HOSPITAL_COMMUNITY): Payer: Self-pay | Admitting: Family

## 2023-10-14 DIAGNOSIS — R630 Anorexia: Secondary | ICD-10-CM

## 2023-10-14 DIAGNOSIS — R4589 Other symptoms and signs involving emotional state: Secondary | ICD-10-CM

## 2023-10-14 DIAGNOSIS — F332 Major depressive disorder, recurrent severe without psychotic features: Secondary | ICD-10-CM

## 2023-10-14 MED ORDER — DULOXETINE HCL 30 MG PO CPEP: 30.0000 mg | ORAL_CAPSULE | Freq: Every day | ORAL | 0 refills | Status: DC

## 2023-10-14 NOTE — Progress Notes (Signed)
Spoke with patient via Teams video call, used 2 identifiers to correctly identify patient. States that groups are going good, no issue or complaints. Did ask for a refill of Cymbalta, message sent to MD for review. Denies SI/HI or AV hallucinations. On scale 1-10 as 10 being worst he rates depression at 3 and anxiety at 1. Pleasant and cooperative.

## 2023-10-14 NOTE — Progress Notes (Addendum)
Virtual Visit via Video Note  I connected with Derek Burns on 10/14/23 at  9:00 AM EST by a video enabled telemedicine application and verified that I am speaking with the correct person using two identifiers.  Location: Patient: Home Provider: Office   I discussed the limitations of evaluation and management by telemedicine and the availability of in person appointments. The patient expressed understanding and agreed to proceed.    I discussed the assessment and treatment plan with the patient. The patient was provided an opportunity to ask questions and all were answered. The patient agreed with the plan and demonstrated an understanding of the instructions.   The patient was advised to call back or seek an in-person evaluation if the symptoms worsen or if the condition fails to improve as anticipated.  I provided 00 minutes of non-face-to-face time during this encounter.   [name], NP   [facility] MD/PA/NP OP Progress Note  10/14/2023 9:47 AM Derek Burns  MRN:  [id number]  Chief Complaint:  Yehuda Mao stated " I am doing alright."   HPI: Derek Burns 57 year old Caucasian male seen and evaluated via care agility.  Currently attending daily group sessions and partial hospitalization programming.  Reports overall his mood has stabilized since attending the program.  Reports some frustration related to chronic pain and recent denial of his disability. "  I guess this is  just a part of life" Denies depression or depressive symptoms.  Reports decreased appetite as he states he goes to episodes when not having the feeling or urge to eat.  Discussed initiating Remeron as appetite stimulant, patient was receptive to trying medication at this time.  Will follow-up for medication adherence/tolerability.    Of note this clinician did not initiate Remeron due to documented history related to BPH and utilization of Flomax. Patient stated that decreased appetite episode happen  every now and then as he states he swallows a lot of air after having surgery on his carotid/neck area.  Discussed possibly utilizing Gas-X to help relieve some of the congestion and follow-up with gastroenteritis and/or primary care provider.  Discussed potential side effects.  Patient was receptive to plan.  Support, encouragement and  reassurance was provided.  Visit Diagnosis:    ICD-10-CM   1. Severe episode of recurrent major depressive disorder, without psychotic features (HCC) [F33.2]  F33.2     2. Poor appetite  R63.0       Past Psychiatric History:   Past Medical History:  Past Medical History:  Diagnosis Date   Complication of anesthesia    hard to wake up, low resting heart rate   DDD (degenerative disc disease), cervical    High cholesterol    Scoliosis     Past Surgical History:  Procedure Laterality Date   ANTERIOR CERVICAL DECOMP/DISCECTOMY FUSION N/A 05/03/2021   Procedure: C4-5, C5-6, C6-7 ANTERIOR CERVICAL DISCECTOMY FUSION, ALLOGRAFT AND PLATE;  Surgeon: Eldred Manges, MD;  Location: [facility] OR;  Service: Orthopedics;  Laterality: N/A;   HERNIA REPAIR     ROTATOR CUFF REPAIR Left     Family Psychiatric History:   Family History: No family history on file.  Social History:  Social History   Socioeconomic History   Marital status: Married    Spouse name: Not on file   Number of children: 1   Years of education: Not on file   Highest education level: Some college, no degree  Occupational History   Not on file  Tobacco Use  Smoking status: Every Day    Current packs/day: 0.50    Types: Cigarettes   Smokeless tobacco: Never  Vaping Use   Vaping status: Never Used  Substance and Sexual Activity   Alcohol use: Yes    Comment: socially   Drug use: Yes    Types: Marijuana   Sexual activity: Not on file  Other Topics Concern   Not on file  Social History Narrative   Not on file   Social Drivers of Health   Financial Resource Strain: Not on file   Food Insecurity: Low Risk  (09/23/2023)   Received from [facility]   Hunger Vital Sign    Worried About Running Out of Food in the Last Year: Never true    Ran Out of Food in the Last Year: Never true  Transportation Needs: No Transportation Needs (09/23/2023)   Received from [facility]    In the past 12 months, has lack of reliable transportation kept you from medical appointments, meetings, work or from getting things needed for daily living? : No  Physical Activity: Not on file  Stress: Not on file  Social Connections: Not on file    Allergies: No Known Allergies  Metabolic Disorder Labs: Lab Results  Component Value Date   HGBA1C 5.8 (H) 09/18/2023   MPG 119.76 09/18/2023   No results found for: "PROLACTIN" Lab Results  Component Value Date   CHOL 195 09/18/2023   TRIG 136 09/18/2023   HDL 33 (L) 09/18/2023   CHOLHDL 5.9 09/18/2023   VLDL 27 09/18/2023   LDLCALC 135 (H) 09/18/2023   No results found for: "TSH"  Therapeutic Level Labs: No results found for: "LITHIUM" No results found for: "VALPROATE" No results found for: "CBMZ"  Current Medications: Current Outpatient Medications  Medication Sig Dispense Refill   acetaminophen (TYLENOL) 500 MG tablet Take 500-1,000 mg by mouth every 6 (six) hours as needed for moderate pain (pain score 4-6).     DULoxetine (CYMBALTA) 30 MG capsule Take 30 mg by mouth daily.     finasteride (PROSCAR) 5 MG tablet Take 5 mg by mouth daily.     gabapentin (NEURONTIN) 300 MG capsule Take 300 mg by mouth at bedtime.     meclizine (ANTIVERT) 25 MG tablet Take 1 tablet (25 mg total) by mouth 3 (three) times daily as needed for dizziness. 30 tablet 0   Multiple Vitamin (MULTIVITAMIN WITH MINERALS) TABS tablet Take 1 tablet by mouth daily.     rosuvastatin (CRESTOR) 20 MG tablet Take 1 tablet (20 mg total) by mouth daily. 30 tablet 0   tamsulosin (FLOMAX) 0.4 MG CAPS capsule Take 0.4 mg by mouth daily.     No  current facility-administered medications for this visit.     Musculoskeletal: Strength & Muscle Tone: within normal limits Gait & Station: normal Patient leans: N/A  Psychiatric Specialty Exam: Review of Systems  Gastrointestinal:        Decreased appetite  Musculoskeletal:  Positive for back pain.  Psychiatric/Behavioral:  Negative for sleep disturbance and suicidal ideas.     There were no vitals taken for this visit.There is no height or weight on file to calculate BMI.  General Appearance: Casual  Eye Contact:  Good  Speech:  Clear and Coherent  Volume:  Normal  Mood:  Anxious and Depressed  Affect:  Congruent  Thought Process:  Coherent  Orientation:  Full (Time, Place, and Person)  Thought Content: Logical   Suicidal Thoughts:  No  Homicidal Thoughts:  No  Memory:  Immediate;   Good Recent;   Good  Judgement:  Good  Insight:  Good  Psychomotor Activity:  Normal  Concentration:  Concentration: Good  Recall:  Good  Fund of Knowledge: Good  Language: Good  Akathisia:  No  Handed:  Right  AIMS (if indicated): not done  Assets:  Communication Skills Desire for Improvement  ADL's:  Intact  Cognition: WNL  Sleep:  Fair   Screenings: Insurance account manager from 09/30/2023 in [facility]PITALIZATION PROGRAM Counselor from 09/11/2023 in [facility] PARTIAL HOSPITALIZATION PROGRAM  PHQ-2 Total Score 3 3  PHQ-9 Total Score 14 20      Flowsheet Row Counselor from 09/30/2023 in [facility]AL [facility] PARTIAL HOSPITALIZATION PROGRAM ED to Hosp-Admission (Discharged) from 09/17/2023 in [facility] Counselor from 09/11/2023 in [facility]AL [facility] PARTIAL HOSPITALIZATION PROGRAM  C-SSRS RISK CATEGORY Error: Q3, 4, or 5 should not be populated when Q2 is No No Risk Error: Question 6 not populated        Assessment and Plan:  Continue Partial Hospitalization Programming Continue medication as  directed  Collaboration of Care: Collaboration of Care: Medication Management AEB keep all outpatient follow-up with pain management and/or primary care provider  Patient/Guardian was advised Release of Information must be obtained prior to any record release in order to collaborate their care with an outside provider. Patient/Guardian was advised if they have not already done so to contact the registration department to sign all necessary forms in order for [place] to release information regarding their care.   Consent: Patient/Guardian gives verbal consent for treatment and assignment of benefits for services provided during this visit. Patient/Guardian expressed understanding and agreed to proceed.    [name], NP 10/14/2023, 9:47 AM

## 2023-10-14 NOTE — Therapy (Signed)
[facility] PARTIAL HOSPITALIZATION PROGRAM [address] [place], [place], [address] Phone: [phone]   Fax:  [phone]  Occupational Therapy Treatment Virtual Visit via Video Note  I connected with Derek Burns on 10/14/23 at  8:00 AM EST by a video enabled telemedicine application and verified that I am speaking with the correct person using two identifiers.  Location: Patient: home Provider: office   I discussed the limitations of evaluation and management by telemedicine and the availability of in person appointments. The patient expressed understanding and agreed to proceed.    The patient was advised to call back or seek an in-person evaluation if the symptoms worsen or if the condition fails to improve as anticipated.  I provided 55 minutes of non-face-to-face time during this encounter.  Patient Details  Name: Derek Burns MRN: [id number] Date of Birth: Feb 16, 1966 No data recorded  Encounter Date: 10/09/2023   OT End of Session - 10/14/23 0911     Visit Number 9    Number of Visits 20    Date for OT Re-Evaluation 11/01/23    OT Start Time 1200    OT Stop Time 1255    OT Time Calculation (min) 55 min             Past Medical History:  Diagnosis Date   Complication of anesthesia    hard to wake up, low resting heart rate   DDD (degenerative disc disease), cervical    High cholesterol    Scoliosis     Past Surgical History:  Procedure Laterality Date   ANTERIOR CERVICAL DECOMP/DISCECTOMY FUSION N/A 05/03/2021   Procedure: C4-5, C5-6, C6-7 ANTERIOR CERVICAL DISCECTOMY FUSION, ALLOGRAFT AND PLATE;  Surgeon: Eldred Manges, MD;  Location: [facility] OR;  Service: Orthopedics;  Laterality: N/A;   HERNIA REPAIR     ROTATOR CUFF REPAIR Left     There were no vitals filed for this visit.   Subjective Assessment - 10/14/23 0911     Currently in Pain? No/denies    Pain Score 0-No pain                Group  Session:  S: Doing better today.   O: The objective of this presentation is to provide a comprehensive understanding of the concept of "motivation" and its role in human behavior and well-being. The content covers various theories of motivation, including intrinsic and extrinsic motivators, and explores the psychological mechanisms that drive individuals to achieve goals, overcome obstacles, and make decisions. By diving into real-world applications, the presentation aims to offer actionable strategies for enhancing motivation in different life domains, such as work, relationships, and personal growth. Utilizing a multi-disciplinary approach, this presentation integrates insights from psychology, neuroscience, and behavioral economics to present a holistic view of motivation. The objective is not only to educate the audience about the complexities and driving forces behind motivation but also to equip them with practical tools and techniques to improve their own motivation levels. By the end of the presentation, attendees should have a well-rounded understanding of what motivates human actions and how to harness this knowledge for personal and professional betterment.   A: The patient demonstrates a high level of engagement during the session, actively participating in discussions about motivation theories and their applicability to their own life. They show keen interest in learning new strategies to improve their motivation and even offer examples from their own experiences that align with the theories presented. Their level of self-awareness  and willingness to invest in self-improvement suggest that they are well-positioned to benefit from the practical tools and techniques discussed. The patient's ability to articulate their goals and challenges further supports the likelihood of successfully implementing the strategies presented. ing from the educational content. The patient's passive approach may  require more directed intervention to ensure they grasp and can apply the principles of motivation to their life.   P: Continue to attend PHP OT group sessions 5x week for 4 weeks to promote daily structure, social engagement, and opportunities to develop and utilize adaptive strategies to maximize functional performance in preparation for safe transition and integration back into school, work, and the community. Plan to address topic of pt 3 in next OT group session.                   OT Education - 10/14/23 0911     Education Details Motivation              OT Short Term Goals - 10/02/23 1006       OT SHORT TERM GOAL #1   Title Patient will be educated on strategies to improve psychosocial skills needed to participate fully in all daily, work, and leisure activities.    Time 4    Period Weeks    Status On-going    Target Date 11/01/23      OT SHORT TERM GOAL #2   Title Pt will apply psychosocial skills and coping mechanisms to daily activities in order to function independently and reintegrate into community dwelling    Status On-going      OT SHORT TERM GOAL #3   Title Pt will choose and/or engage in 1-3 socially engaging leisure activities to improve social participation skills upon reintegrating into community    Status On-going                      Plan - 10/14/23 0911     Psychosocial Skills Habits;Coping Strategies;Interpersonal Interaction;Routines and Behaviors             Patient will benefit from skilled therapeutic intervention in order to improve the following deficits and impairments:       Psychosocial Skills: Habits, Coping Strategies, Interpersonal Interaction, Routines and Behaviors   Visit Diagnosis: Difficulty coping    Problem List Patient Active Problem List   Diagnosis Date Noted   Dizziness 09/17/2023   Chronic radicular lumbar pain 09/17/2023   BPH (benign prostatic hyperplasia) 09/17/2023   Tobacco  use 09/17/2023   MDD (major depressive disorder), recurrent episode, severe (HCC) 09/11/2023   S/P cervical spinal fusion 05/10/2021   Cervical spinal stenosis 05/03/2021   Spinal stenosis of cervical region 02/11/2021    Ted Mcalpine, OT 10/14/2023, 9:13 AM Kerrin Champagne, OT  [facility] HOSPITALIZATION PROGRAM [address] [place], [place], [address] Phone: [phone]   Fax:  [phone]  Name: Derek Burns MRN: [id number] Date of Birth: 25-Mar-1966

## 2023-10-14 NOTE — Progress Notes (Signed)
Refill of patient's Cymbalta sent to [facility] on [address] in [place].  Lamar Sprinkles, MD PGY-3 10/14/2023  12:05 PM [facility] Psych[facility] Res[facility]

## 2023-10-14 NOTE — Therapy (Signed)
[facility] PARTIAL HOSPITALIZATION PROGRAM [address] [place], [place], [address] Phone: [phone]   Fax:  [phone]  Occupational Therapy Treatment Virtual Visit via Video Note  I connected with Derek Burns on 10/14/23 at  8:00 AM EST by a video enabled telemedicine application and verified that I am speaking with the correct person using two identifiers.  Location: Patient: home Provider: office   I discussed the limitations of evaluation and management by telemedicine and the availability of in person appointments. The patient expressed understanding and agreed to proceed.    The patient was advised to call back or seek an in-person evaluation if the symptoms worsen or if the condition fails to improve as anticipated.  I provided 55 minutes of non-face-to-face time during this encounter.   Patient Details  Name: Derek Burns MRN: [id number] Date of Birth: [birth year] No data recorded  Encounter Date: 10/12/2023   OT End of Session - 10/14/23 0944     Visit Number 10    Number of Visits 20    Date for OT Re-Evaluation 11/01/23    OT Start Time 1200    OT Stop Time 1255    OT Time Calculation (min) 55 min             Past Medical History:  Diagnosis Date   Complication of anesthesia    hard to wake up, low resting heart rate   DDD (degenerative disc disease), cervical    High cholesterol    Scoliosis     Past Surgical History:  Procedure Laterality Date   ANTERIOR CERVICAL DECOMP/DISCECTOMY FUSION N/A 05/03/2021   Procedure: C4-5, C5-6, C6-7 ANTERIOR CERVICAL DISCECTOMY FUSION, ALLOGRAFT AND PLATE;  Surgeon: Eldred Manges, MD;  Location: [facility] OR;  Service: Orthopedics;  Laterality: N/A;   HERNIA REPAIR     ROTATOR CUFF REPAIR Left     There were no vitals filed for this visit.   Subjective Assessment - 10/14/23 0944     Currently in Pain? No/denies    Pain Score 0-No pain                 Group  Session:  S: Doing better today.   O: The objective of this presentation is to provide a comprehensive understanding of the concept of "motivation" and its role in human behavior and well-being. The content covers various theories of motivation, including intrinsic and extrinsic motivators, and explores the psychological mechanisms that drive individuals to achieve goals, overcome obstacles, and make decisions. By diving into real-world applications, the presentation aims to offer actionable strategies for enhancing motivation in different life domains, such as work, relationships, and personal growth. Utilizing a multi-disciplinary approach, this presentation integrates insights from psychology, neuroscience, and behavioral economics to present a holistic view of motivation. The objective is not only to educate the audience about the complexities and driving forces behind motivation but also to equip them with practical tools and techniques to improve their own motivation levels. By the end of the presentation, attendees should have a well-rounded understanding of what motivates human actions and how to harness this knowledge for personal and professional betterment.   A: The patient demonstrates a high level of engagement during the session, actively participating in discussions about motivation theories and their applicability to their own life. They show keen interest in learning new strategies to improve their motivation and even offer examples from their own experiences that align with the theories presented. Their level  of self-awareness and willingness to invest in self-improvement suggest that they are well-positioned to benefit from the practical tools and techniques discussed. The patient's ability to articulate their goals and challenges further supports the likelihood of successfully implementing the strategies presented.    P: Continue to attend PHP OT group sessions 5x week for 4 weeks to  promote daily structure, social engagement, and opportunities to develop and utilize adaptive strategies to maximize functional performance in preparation for safe transition and integration back into school, work, and the community. Plan to address topic of tbd in next OT group session.                  OT Education - 10/14/23 0944     Education Details Motivation              OT Short Term Goals - 10/02/23 1006       OT SHORT TERM GOAL #1   Title Patient will be educated on strategies to improve psychosocial skills needed to participate fully in all daily, work, and leisure activities.    Time 4    Period Weeks    Status On-going    Target Date 11/01/23      OT SHORT TERM GOAL #2   Title Pt will apply psychosocial skills and coping mechanisms to daily activities in order to function independently and reintegrate into community dwelling    Status On-going      OT SHORT TERM GOAL #3   Title Pt will choose and/or engage in 1-3 socially engaging leisure activities to improve social participation skills upon reintegrating into community    Status On-going                      Plan - 10/14/23 0945     Psychosocial Skills Habits;Coping Strategies;Interpersonal Interaction;Routines and Behaviors             Patient will benefit from skilled therapeutic intervention in order to improve the following deficits and impairments:       Psychosocial Skills: Habits, Coping Strategies, Interpersonal Interaction, Routines and Behaviors   Visit Diagnosis: Difficulty coping    Problem List Patient Active Problem List   Diagnosis Date Noted   Dizziness 09/17/2023   Chronic radicular lumbar pain 09/17/2023   BPH (benign prostatic hyperplasia) 09/17/2023   Tobacco use 09/17/2023   MDD (major depressive disorder), recurrent episode, severe (HCC) 09/11/2023   S/P cervical spinal fusion 05/10/2021   Cervical spinal stenosis 05/03/2021   Spinal  stenosis of cervical region 02/11/2021    Ted Mcalpine, OT 10/14/2023, 9:45 AM Kerrin Champagne, OT  [facility] HOSPITALIZATION PROGRAM [address] [place], [place], [address] Phone: [phone]   Fax:  [phone]  Name: Derek Burns MRN: [id number] Date of Birth: January 09, 1966

## 2023-10-14 NOTE — Therapy (Signed)
[facility] PARTIAL HOSPITALIZATION PROGRAM [address] [place], [place], [address] Phone: [phone]   Fax:  [phone]  Occupational Therapy Treatment Virtual Visit via Video Note  I connected with Derek Burns on 10/14/23 at  8:00 AM EST by a video enabled telemedicine application and verified that I am speaking with the correct person using two identifiers.  Location: Patient: home Provider: office   I discussed the limitations of evaluation and management by telemedicine and the availability of in person appointments. The patient expressed understanding and agreed to proceed.    The patient was advised to call back or seek an in-person evaluation if the symptoms worsen or if the condition fails to improve as anticipated.  I provided 55 minutes of non-face-to-face time during this encounter.   Patient Details  Name: Derek Burns MRN: [id number] Date of Birth: May 21, 1966 No data recorded  Encounter Date: 10/08/2023   OT End of Session - 10/14/23 0843     Visit Number 8    Number of Visits 20    Date for OT Re-Evaluation 11/01/23    OT Start Time 1200    OT Stop Time 1255    OT Time Calculation (min) 55 min             Past Medical History:  Diagnosis Date   Complication of anesthesia    hard to wake up, low resting heart rate   DDD (degenerative disc disease), cervical    High cholesterol    Scoliosis     Past Surgical History:  Procedure Laterality Date   ANTERIOR CERVICAL DECOMP/DISCECTOMY FUSION N/A 05/03/2021   Procedure: C4-5, C5-6, C6-7 ANTERIOR CERVICAL DISCECTOMY FUSION, ALLOGRAFT AND PLATE;  Surgeon: Eldred Manges, MD;  Location: [facility] OR;  Service: Orthopedics;  Laterality: N/A;   HERNIA REPAIR     ROTATOR CUFF REPAIR Left     There were no vitals filed for this visit.   Subjective Assessment - 10/14/23 0842     Currently in Pain? No/denies    Pain Score 0-No pain                Group  Session:  S: Doing better today.   O: The objective of this presentation is to provide a comprehensive understanding of the concept of "motivation" and its role in human behavior and well-being. The content covers various theories of motivation, including intrinsic and extrinsic motivators, and explores the psychological mechanisms that drive individuals to achieve goals, overcome obstacles, and make decisions. By diving into real-world applications, the presentation aims to offer actionable strategies for enhancing motivation in different life domains, such as work, relationships, and personal growth. Utilizing a multi-disciplinary approach, this presentation integrates insights from psychology, neuroscience, and behavioral economics to present a holistic view of motivation. The objective is not only to educate the audience about the complexities and driving forces behind motivation but also to equip them with practical tools and techniques to improve their own motivation levels. By the end of the presentation, attendees should have a well-rounded understanding of what motivates human actions and how to harness this knowledge for personal and professional betterment.   A: The patient demonstrates a high level of engagement during the session, actively participating in discussions about motivation theories and their applicability to their own life. They show keen interest in learning new strategies to improve their motivation and even offer examples from their own experiences that align with the theories presented. Their level of  self-awareness and willingness to invest in self-improvement suggest that they are well-positioned to benefit from the practical tools and techniques discussed. The patient's ability to articulate their goals and challenges further supports the likelihood of successfully implementing the strategies presented.    P: Continue to attend PHP OT group sessions 5x week for 4 weeks to  promote daily structure, social engagement, and opportunities to develop and utilize adaptive strategies to maximize functional performance in preparation for safe transition and integration back into school, work, and the community. Plan to address topic of pt 2 in next OT group session.                   OT Education - 10/14/23 [phone]     Education Details Motivation              OT Short Term Goals - 10/02/23 1006       OT SHORT TERM GOAL #1   Title Patient will be educated on strategies to improve psychosocial skills needed to participate fully in all daily, work, and leisure activities.    Time 4    Period Weeks    Status On-going    Target Date 11/01/23      OT SHORT TERM GOAL #2   Title Pt will apply psychosocial skills and coping mechanisms to daily activities in order to function independently and reintegrate into community dwelling    Status On-going      OT SHORT TERM GOAL #3   Title Pt will choose and/or engage in 1-3 socially engaging leisure activities to improve social participation skills upon reintegrating into community    Status On-going                      Plan - 10/14/23 0843     Occupational performance deficits (Please refer to evaluation for details): Rest and Sleep;Leisure;Social Participation    Psychosocial Skills Habits;Coping Strategies;Interpersonal Interaction;Routines and Behaviors             Patient will benefit from skilled therapeutic intervention in order to improve the following deficits and impairments:       Psychosocial Skills: Habits, Coping Strategies, Interpersonal Interaction, Routines and Behaviors   Visit Diagnosis: Difficulty coping    Problem List Patient Active Problem List   Diagnosis Date Noted   Dizziness 09/17/2023   Chronic radicular lumbar pain 09/17/2023   BPH (benign prostatic hyperplasia) 09/17/2023   Tobacco use 09/17/2023   MDD (major depressive disorder), recurrent  episode, severe (HCC) 09/11/2023   S/P cervical spinal fusion 05/10/2021   Cervical spinal stenosis 05/03/2021   Spinal stenosis of cervical region 02/11/2021    Ted Mcalpine, OT 10/14/2023, 8:44 AM  Kerrin Champagne, OT   [facility] HOSPITALIZATION PROGRAM [address] [place], [place], [address] Phone: [phone]   Fax:  [phone]  Name: Derek Burns MRN: [id number] Date of Birth: [birth year]

## 2023-10-15 ENCOUNTER — Other Ambulatory Visit (HOSPITAL_COMMUNITY): Payer: PRIVATE HEALTH INSURANCE

## 2023-10-15 ENCOUNTER — Other Ambulatory Visit (HOSPITAL_COMMUNITY): Payer: Self-pay | Admitting: Licensed Clinical Social Worker

## 2023-10-15 DIAGNOSIS — F332 Major depressive disorder, recurrent severe without psychotic features: Secondary | ICD-10-CM

## 2023-10-15 DIAGNOSIS — R4589 Other symptoms and signs involving emotional state: Secondary | ICD-10-CM

## 2023-10-16 ENCOUNTER — Other Ambulatory Visit (HOSPITAL_COMMUNITY): Payer: Self-pay | Admitting: Licensed Clinical Social Worker

## 2023-10-16 ENCOUNTER — Other Ambulatory Visit (HOSPITAL_COMMUNITY): Payer: PRIVATE HEALTH INSURANCE

## 2023-10-16 DIAGNOSIS — R4589 Other symptoms and signs involving emotional state: Secondary | ICD-10-CM

## 2023-10-16 DIAGNOSIS — F332 Major depressive disorder, recurrent severe without psychotic features: Secondary | ICD-10-CM

## 2023-10-16 MED ORDER — DULOXETINE HCL 30 MG PO CPEP: 30.0000 mg | ORAL_CAPSULE | Freq: Every day | ORAL | 0 refills | Status: AC

## 2023-10-16 NOTE — Progress Notes (Signed)
Medication was refilled and pharmacy was updated

## 2023-10-19 ENCOUNTER — Encounter (HOSPITAL_COMMUNITY): Payer: Self-pay

## 2023-10-19 ENCOUNTER — Other Ambulatory Visit (HOSPITAL_COMMUNITY): Payer: Self-pay | Admitting: Licensed Clinical Social Worker

## 2023-10-19 ENCOUNTER — Other Ambulatory Visit (HOSPITAL_COMMUNITY): Payer: PRIVATE HEALTH INSURANCE

## 2023-10-19 DIAGNOSIS — R4589 Other symptoms and signs involving emotional state: Secondary | ICD-10-CM

## 2023-10-19 DIAGNOSIS — F332 Major depressive disorder, recurrent severe without psychotic features: Secondary | ICD-10-CM

## 2023-10-19 NOTE — Therapy (Signed)
[facility] PARTIAL HOSPITALIZATION PROGRAM [address] [place], [place], [address] Phone: [phone]   Fax:  [phone]  Occupational Therapy Treatment Virtual Visit via Video Note  I connected with Derek Burns on 10/19/23 at  8:00 AM EST by a video enabled telemedicine application and verified that I am speaking with the correct person using two identifiers.  Location: Patient: home Provider: office   I discussed the limitations of evaluation and management by telemedicine and the availability of in person appointments. The patient expressed understanding and agreed to proceed.    The patient was advised to call back or seek an in-person evaluation if the symptoms worsen or if the condition fails to improve as anticipated.  I provided 55 minutes of non-face-to-face time during this encounter.   Patient Details  Name: Derek Burns MRN: [id number] Date of Birth: May 23, 1966 No data recorded  Encounter Date: 10/14/2023   OT End of Session - 10/19/23 0924     Visit Number 13    Number of Visits 20    Date for OT Re-Evaluation 11/01/23    OT Start Time 1200    OT Stop Time 1255    OT Time Calculation (min) 55 min             Past Medical History:  Diagnosis Date   Complication of anesthesia    hard to wake up, low resting heart rate   DDD (degenerative disc disease), cervical    High cholesterol    Scoliosis     Past Surgical History:  Procedure Laterality Date   ANTERIOR CERVICAL DECOMP/DISCECTOMY FUSION N/A 05/03/2021   Procedure: C4-5, C5-6, C6-7 ANTERIOR CERVICAL DISCECTOMY FUSION, ALLOGRAFT AND PLATE;  Surgeon: Eldred Manges, MD;  Location: [facility] OR;  Service: Orthopedics;  Laterality: N/A;   HERNIA REPAIR     ROTATOR CUFF REPAIR Left     There were no vitals filed for this visit.   Subjective Assessment - 10/19/23 0924     Currently in Pain? No/denies    Pain Score 0-No pain                 Group  Session:  S: Feeling better today.   O: The objective of the telehealth group therapy session was to discuss the significance of routines in promoting mental health and wellbeing. The OT aimed to explore the power of routines in providing structure, stability, and predictability in individuals' lives, as well as their role in establishing healthy habits, reducing stress and anxiety, managing time effectively, achieving goals, and fostering a sense of community and social connectedness.   Participants were guided to identify areas where routines could improve their mental health and were provided with tips for establishing and maintaining healthy routines. The session concluded by emphasizing the potential of routines to enhance overall quality of life.  Homework Assignment:  As part of the session, participants were assigned a homework task to reflect on their current routines and select one area of their lives where they could establish a new routine to improve their mental health and wellbeing. They were instructed to start small and implement the routine gradually, while seeking support from friends, family, or mental health professionals as needed. The participants were asked to report their progress in the next therapy session, focusing on the benefits and challenges encountered during the implementation of their chosen routine.   A:  The patient showed motivation and willingness to reflect on their current habits and  behaviors, recognizing the need for positive changes. They actively engaged in the session, sharing personal experiences and challenges related to establishing and maintaining healthy routines. The patient expressed a desire to reduce stress and anxiety, manage their time more effectively, and achieve their goals through the implementation of routines.  T P: Continue to attend PHP OT group sessions 5x week for 4 weeks to promote daily structure, social engagement, and opportunities  to develop and utilize adaptive strategies to maximize functional performance in preparation for safe transition and integration back into school, work, and the community. Plan to address topic of tbd in next OT group session.                  OT Education - 10/19/23 0924     Education Details Routines              OT Short Term Goals - 10/02/23 1006       OT SHORT TERM GOAL #1   Title Patient will be educated on strategies to improve psychosocial skills needed to participate fully in all daily, work, and leisure activities.    Time 4    Period Weeks    Status On-going    Target Date 11/01/23      OT SHORT TERM GOAL #2   Title Pt will apply psychosocial skills and coping mechanisms to daily activities in order to function independently and reintegrate into community dwelling    Status On-going      OT SHORT TERM GOAL #3   Title Pt will choose and/or engage in 1-3 socially engaging leisure activities to improve social participation skills upon reintegrating into community    Status On-going                      Plan - 10/19/23 0925     Psychosocial Skills Habits;Coping Strategies;Interpersonal Interaction;Routines and Behaviors             Patient will benefit from skilled therapeutic intervention in order to improve the following deficits and impairments:       Psychosocial Skills: Habits, Coping Strategies, Interpersonal Interaction, Routines and Behaviors   Visit Diagnosis: Difficulty coping    Problem List Patient Active Problem List   Diagnosis Date Noted   Dizziness 09/17/2023   Chronic radicular lumbar pain 09/17/2023   BPH (benign prostatic hyperplasia) 09/17/2023   Tobacco use 09/17/2023   MDD (major depressive disorder), recurrent episode, severe (HCC) 09/11/2023   S/P cervical spinal fusion 05/10/2021   Cervical spinal stenosis 05/03/2021   Spinal stenosis of cervical region 02/11/2021    Ted Mcalpine,  OT 10/19/2023, 9:25 AM  Kerrin Champagne, OT   [facility] HOSPITALIZATION PROGRAM [address] [place], [place], [address] Phone: [phone]   Fax:  [phone]  Name: Derek Burns MRN: [id number] Date of Birth: [birth year]

## 2023-10-19 NOTE — Therapy (Signed)
[facility] PARTIAL HOSPITALIZATION PROGRAM [address] [place], [place], [address] Phone: [phone]   Fax:  [phone]  Occupational Therapy Treatment Virtual Visit via Video Note  I connected with Derek Burns on 10/19/23 at  9:00 AM EST by a video enabled telemedicine application and verified that I am speaking with the correct person using two identifiers.  Location: Patient: home Provider: office   I discussed the limitations of evaluation and management by telemedicine and the availability of in person appointments. The patient expressed understanding and agreed to proceed.    The patient was advised to call back or seek an in-person evaluation if the symptoms worsen or if the condition fails to improve as anticipated.  I provided 55 minutes of non-face-to-face time during this encounter.  Patient Details  Name: Derek Burns MRN: [id number] Date of Birth: 04/17/1966 No data recorded  Encounter Date: 10/12/2023   OT End of Session - 10/19/23 0905     Visit Number 11    Number of Visits 20    Date for OT Re-Evaluation 11/01/23    OT Start Time 1200    OT Stop Time 1255    OT Time Calculation (min) 55 min             Past Medical History:  Diagnosis Date   Complication of anesthesia    hard to wake up, low resting heart rate   DDD (degenerative disc disease), cervical    High cholesterol    Scoliosis     Past Surgical History:  Procedure Laterality Date   ANTERIOR CERVICAL DECOMP/DISCECTOMY FUSION N/A 05/03/2021   Procedure: C4-5, C5-6, C6-7 ANTERIOR CERVICAL DISCECTOMY FUSION, ALLOGRAFT AND PLATE;  Surgeon: Eldred Manges, MD;  Location: [facility] OR;  Service: Orthopedics;  Laterality: N/A;   HERNIA REPAIR     ROTATOR CUFF REPAIR Left     There were no vitals filed for this visit.   Subjective Assessment - 10/19/23 0905     Currently in Pain? No/denies    Pain Score 0-No pain                    Group  Session:  S: Doing better today.   O: The objective of this presentation is to provide a comprehensive understanding of the concept of "motivation" and its role in human behavior and well-being. The content covers various theories of motivation, including intrinsic and extrinsic motivators, and explores the psychological mechanisms that drive individuals to achieve goals, overcome obstacles, and make decisions. By diving into real-world applications, the presentation aims to offer actionable strategies for enhancing motivation in different life domains, such as work, relationships, and personal growth. Utilizing a multi-disciplinary approach, this presentation integrates insights from psychology, neuroscience, and behavioral economics to present a holistic view of motivation. The objective is not only to educate the audience about the complexities and driving forces behind motivation but also to equip them with practical tools and techniques to improve their own motivation levels. By the end of the presentation, attendees should have a well-rounded understanding of what motivates human actions and how to harness this knowledge for personal and professional betterment.   A:  The patient demonstrates a high level of engagement during the session, actively participating in discussions about motivation theories and their applicability to their own life. They show keen interest in learning new strategies to improve their motivation and even offer examples from their own experiences that align with the theories  presented. Their level of self-awareness and willingness to invest in self-improvement suggest that they are well-positioned to benefit from the practical tools and techniques discussed. The patient's ability to articulate their goals and challenges further supports the likelihood of successfully implementing the strategies presented.    P: Continue to attend PHP OT group sessions 5x week for 4 weeks  to promote daily structure, social engagement, and opportunities to develop and utilize adaptive strategies to maximize functional performance in preparation for safe transition and integration back into school, work, and the community. Plan to address topic of tbd in next OT group session.               OT Education - 10/19/23 0905     Education Details Motivation              OT Short Term Goals - 10/02/23 1006       OT SHORT TERM GOAL #1   Title Patient will be educated on strategies to improve psychosocial skills needed to participate fully in all daily, work, and leisure activities.    Time 4    Period Weeks    Status On-going    Target Date 11/01/23      OT SHORT TERM GOAL #2   Title Pt will apply psychosocial skills and coping mechanisms to daily activities in order to function independently and reintegrate into community dwelling    Status On-going      OT SHORT TERM GOAL #3   Title Pt will choose and/or engage in 1-3 socially engaging leisure activities to improve social participation skills upon reintegrating into community    Status On-going                      Plan - 10/19/23 0905     Psychosocial Skills Habits;Coping Strategies;Interpersonal Interaction;Routines and Behaviors             Patient will benefit from skilled therapeutic intervention in order to improve the following deficits and impairments:       Psychosocial Skills: Habits, Coping Strategies, Interpersonal Interaction, Routines and Behaviors   Visit Diagnosis: Difficulty coping    Problem List Patient Active Problem List   Diagnosis Date Noted   Dizziness 09/17/2023   Chronic radicular lumbar pain 09/17/2023   BPH (benign prostatic hyperplasia) 09/17/2023   Tobacco use 09/17/2023   MDD (major depressive disorder), recurrent episode, severe (HCC) 09/11/2023   S/P cervical spinal fusion 05/10/2021   Cervical spinal stenosis 05/03/2021   Spinal stenosis  of cervical region 02/11/2021    Ted Mcalpine, OT 10/19/2023, 9:06 AM  Kerrin Champagne, OT   [facility] HOSPITALIZATION PROGRAM [address] [place], [place], [address] Phone: [phone]   Fax:  [phone]  Name: Derek Burns MRN: [id number] Date of Birth: [birth year]

## 2023-10-19 NOTE — Therapy (Signed)
[facility] PARTIAL HOSPITALIZATION PROGRAM [address] [place], [place], [address] Phone: [phone]   Fax:  [phone]  Occupational Therapy Treatment Virtual Visit via Video Note  I connected with Derek Burns on 10/19/23 at  8:00 AM EST by a video enabled telemedicine application and verified that I am speaking with the correct person using two identifiers.  Location: Patient: home Provider: office   I discussed the limitations of evaluation and management by telemedicine and the availability of in person appointments. The patient expressed understanding and agreed to proceed.    The patient was advised to call back or seek an in-person evaluation if the symptoms worsen or if the condition fails to improve as anticipated.  I provided 55 minutes of non-face-to-face time during this encounter.  Patient Details  Name: Derek Burns MRN: [id number] Date of Birth: 13-Aug-1966 No data recorded  Encounter Date: 10/13/2023   OT End of Session - 10/19/23 0908     Visit Number 12    Number of Visits 20    Date for OT Re-Evaluation 11/01/23    OT Start Time 1200    OT Stop Time 1255    OT Time Calculation (min) 55 min             Past Medical History:  Diagnosis Date   Complication of anesthesia    hard to wake up, low resting heart rate   DDD (degenerative disc disease), cervical    High cholesterol    Scoliosis     Past Surgical History:  Procedure Laterality Date   ANTERIOR CERVICAL DECOMP/DISCECTOMY FUSION N/A 05/03/2021   Procedure: C4-5, C5-6, C6-7 ANTERIOR CERVICAL DISCECTOMY FUSION, ALLOGRAFT AND PLATE;  Surgeon: Eldred Manges, MD;  Location: [facility] OR;  Service: Orthopedics;  Laterality: N/A;   HERNIA REPAIR     ROTATOR CUFF REPAIR Left     There were no vitals filed for this visit.   Subjective Assessment - 10/19/23 0908     Currently in Pain? No/denies    Pain Score 0-No pain               Group  Session:  S: Doing better today.   O: The objective of the telehealth group therapy session was to discuss the significance of routines in promoting mental health and wellbeing. The OT aimed to explore the power of routines in providing structure, stability, and predictability in individuals' lives, as well as their role in establishing healthy habits, reducing stress and anxiety, managing time effectively, achieving goals, and fostering a sense of community and social connectedness.   Participants were guided to identify areas where routines could improve their mental health and were provided with tips for establishing and maintaining healthy routines. The session concluded by emphasizing the potential of routines to enhance overall quality of life.  Homework Assignment:  As part of the session, participants were assigned a homework task to reflect on their current routines and select one area of their lives where they could establish a new routine to improve their mental health and wellbeing. They were instructed to start small and implement the routine gradually, while seeking support from friends, family, or mental health professionals as needed. The participants were asked to report their progress in the next therapy session, focusing on the benefits and challenges encountered during the implementation of their chosen routine.   A: During the telehealth group therapy session, the patient actively participated in the discussion on the importance of  routines in promoting mental health and wellbeing. The patient demonstrated a good understanding of the power of routines in providing structure, stability, and predictability in their life. They were able to identify areas in their life where routines could contribute to improving their overall mental health.     P: Continue to attend PHP OT group sessions 5x week for 4 weeks to promote daily structure, social engagement, and opportunities to develop  and utilize adaptive strategies to maximize functional performance in preparation for safe transition and integration back into school, work, and the community. Plan to address topic of tbd in next OT group session.                       OT Education - 10/19/23 0908     Education Details Routines              OT Short Term Goals - 10/02/23 1006       OT SHORT TERM GOAL #1   Title Patient will be educated on strategies to improve psychosocial skills needed to participate fully in all daily, work, and leisure activities.    Time 4    Period Weeks    Status On-going    Target Date 11/01/23      OT SHORT TERM GOAL #2   Title Pt will apply psychosocial skills and coping mechanisms to daily activities in order to function independently and reintegrate into community dwelling    Status On-going      OT SHORT TERM GOAL #3   Title Pt will choose and/or engage in 1-3 socially engaging leisure activities to improve social participation skills upon reintegrating into community    Status On-going                      Plan - 10/19/23 0908     Psychosocial Skills Habits;Coping Strategies;Interpersonal Interaction;Routines and Behaviors             Patient will benefit from skilled therapeutic intervention in order to improve the following deficits and impairments:       Psychosocial Skills: Habits, Coping Strategies, Interpersonal Interaction, Routines and Behaviors   Visit Diagnosis: Difficulty coping    Problem List Patient Active Problem List   Diagnosis Date Noted   Dizziness 09/17/2023   Chronic radicular lumbar pain 09/17/2023   BPH (benign prostatic hyperplasia) 09/17/2023   Tobacco use 09/17/2023   MDD (major depressive disorder), recurrent episode, severe (HCC) 09/11/2023   S/P cervical spinal fusion 05/10/2021   Cervical spinal stenosis 05/03/2021   Spinal stenosis of cervical region 02/11/2021    Ted Mcalpine,  OT 10/19/2023, 9:09 AM  Kerrin Champagne, OT   [facility] HOSPITALIZATION PROGRAM [address] [place], [place], [address] Phone: [phone]   Fax:  [phone]  Name: Derek Burns MRN: [id number] Date of Birth: [birth year]

## 2023-10-19 NOTE — Therapy (Signed)
[facility] PARTIAL HOSPITALIZATION PROGRAM [address] [place], [place], [address] Phone: [phone]   Fax:  [phone]  Occupational Therapy Treatment Virtual Visit via Video Note  I connected with Derek Burns on 10/19/23 at  8:00 AM EST by a video enabled telemedicine application and verified that I am speaking with the correct person using two identifiers.  Location: Patient: home Provider: office   I discussed the limitations of evaluation and management by telemedicine and the availability of in person appointments. The patient expressed understanding and agreed to proceed.    The patient was advised to call back or seek an in-person evaluation if the symptoms worsen or if the condition fails to improve as anticipated.  I provided 55 minutes of non-face-to-face time during this encounter.   Patient Details  Name: Derek Burns MRN: [id number] Date of Birth: November 20, 1965 No data recorded  Encounter Date: 10/15/2023   OT End of Session - 10/19/23 0935     Visit Number 14    Number of Visits 20    Date for OT Re-Evaluation 11/01/23             Past Medical History:  Diagnosis Date   Complication of anesthesia    hard to wake up, low resting heart rate   DDD (degenerative disc disease), cervical    High cholesterol    Scoliosis     Past Surgical History:  Procedure Laterality Date   ANTERIOR CERVICAL DECOMP/DISCECTOMY FUSION N/A 05/03/2021   Procedure: C4-5, C5-6, C6-7 ANTERIOR CERVICAL DISCECTOMY FUSION, ALLOGRAFT AND PLATE;  Surgeon: Eldred Manges, MD;  Location: [facility] OR;  Service: Orthopedics;  Laterality: N/A;   HERNIA REPAIR     ROTATOR CUFF REPAIR Left     There were no vitals filed for this visit.   Subjective Assessment - 10/19/23 0935     Currently in Pain? No/denies    Pain Score 0-No pain                  Group Session:  S: Feeling better today.   O: The objective of the telehealth group  therapy session was to discuss the significance of routines in promoting mental health and wellbeing. The OT aimed to explore the power of routines in providing structure, stability, and predictability in individuals' lives, as well as their role in establishing healthy habits, reducing stress and anxiety, managing time effectively, achieving goals, and fostering a sense of community and social connectedness.   Participants were guided to identify areas where routines could improve their mental health and were provided with tips for establishing and maintaining healthy routines. The session concluded by emphasizing the potential of routines to enhance overall quality of life.  Homework Assignment:  As part of the session, participants were assigned a homework task to reflect on their current routines and select one area of their lives where they could establish a new routine to improve their mental health and wellbeing. They were instructed to start small and implement the routine gradually, while seeking support from friends, family, or mental health professionals as needed. The participants were asked to report their progress in the next therapy session, focusing on the benefits and challenges encountered during the implementation of their chosen routine.   A:  The patient actively participated in the discussion of tips for establishing and maintaining healthy routines. They demonstrated an understanding of the importance of starting small, being consistent, and gradually increasing the complexity of their routines.  The patient expressed a willingness to remain flexible and adaptable, acknowledging that adjustments might be necessary as circumstances and priorities change over time.    P: Continue to attend PHP OT group sessions 5x week for 4 weeks to promote daily structure, social engagement, and opportunities to develop and utilize adaptive strategies to maximize functional performance in  preparation for safe transition and integration back into school, work, and the community. Plan to address topic of tbd in next OT group session.                 OT Education - 10/19/23 0935     Education Details Routines              OT Short Term Goals - 10/02/23 1006       OT SHORT TERM GOAL #1   Title Patient will be educated on strategies to improve psychosocial skills needed to participate fully in all daily, work, and leisure activities.    Time 4    Period Weeks    Status On-going    Target Date 11/01/23      OT SHORT TERM GOAL #2   Title Pt will apply psychosocial skills and coping mechanisms to daily activities in order to function independently and reintegrate into community dwelling    Status On-going      OT SHORT TERM GOAL #3   Title Pt will choose and/or engage in 1-3 socially engaging leisure activities to improve social participation skills upon reintegrating into community    Status On-going                      Plan - 10/19/23 0935     Psychosocial Skills Habits;Coping Strategies;Interpersonal Interaction;Routines and Behaviors             Patient will benefit from skilled therapeutic intervention in order to improve the following deficits and impairments:       Psychosocial Skills: Habits, Coping Strategies, Interpersonal Interaction, Routines and Behaviors   Visit Diagnosis: Difficulty coping    Problem List Patient Active Problem List   Diagnosis Date Noted   Dizziness 09/17/2023   Chronic radicular lumbar pain 09/17/2023   BPH (benign prostatic hyperplasia) 09/17/2023   Tobacco use 09/17/2023   MDD (major depressive disorder), recurrent episode, severe (HCC) 09/11/2023   S/P cervical spinal fusion 05/10/2021   Cervical spinal stenosis 05/03/2021   Spinal stenosis of cervical region 02/11/2021    Ted Mcalpine, OT 10/19/2023, 9:36 AM  Kerrin Champagne, OT   [facility]  HOSPITALIZATION PROGRAM [address] [place], [place], [address] Phone: [phone]   Fax:  [phone]  Name: Derek Burns MRN: [id number] Date of Birth: 11-18-1965

## 2023-10-20 ENCOUNTER — Other Ambulatory Visit (HOSPITAL_COMMUNITY): Payer: PRIVATE HEALTH INSURANCE

## 2023-10-20 ENCOUNTER — Other Ambulatory Visit (HOSPITAL_COMMUNITY): Payer: PRIVATE HEALTH INSURANCE | Admitting: Licensed Clinical Social Worker

## 2023-10-20 ENCOUNTER — Encounter (HOSPITAL_COMMUNITY): Payer: Self-pay

## 2023-10-20 DIAGNOSIS — F332 Major depressive disorder, recurrent severe without psychotic features: Secondary | ICD-10-CM

## 2023-10-20 NOTE — Therapy (Signed)
[facility] PARTIAL HOSPITALIZATION PROGRAM [address] [place], [place], [address] Phone: [phone]   Fax:  [phone]  Occupational Therapy Treatment Virtual Visit via Video Note  I connected with Derek Burns on 10/20/23 at  8:00 AM EST by a video enabled telemedicine application and verified that I am speaking with the correct person using two identifiers.  Location: Patient: home Provider: office   I discussed the limitations of evaluation and management by telemedicine and the availability of in person appointments. The patient expressed understanding and agreed to proceed.    The patient was advised to call back or seek an in-person evaluation if the symptoms worsen or if the condition fails to improve as anticipated.  I provided 55 minutes of non-face-to-face time during this encounter.   Patient Details  Name: Derek Burns MRN: [id number] Date of Birth: 05-20-1966 No data recorded  Encounter Date: 10/19/2023   OT End of Session - 10/20/23 1653     Visit Number 16    Number of Visits 20    Date for OT Re-Evaluation 11/01/23    OT Start Time 1200    OT Stop Time 1255    OT Time Calculation (min) 55 min    Activity Tolerance Patient tolerated treatment well             Past Medical History:  Diagnosis Date   Complication of anesthesia    hard to wake up, low resting heart rate   DDD (degenerative disc disease), cervical    High cholesterol    Scoliosis     Past Surgical History:  Procedure Laterality Date   ANTERIOR CERVICAL DECOMP/DISCECTOMY FUSION N/A 05/03/2021   Procedure: C4-5, C5-6, C6-7 ANTERIOR CERVICAL DISCECTOMY FUSION, ALLOGRAFT AND PLATE;  Surgeon: Eldred Manges, MD;  Location: [facility] OR;  Service: Orthopedics;  Laterality: N/A;   HERNIA REPAIR     ROTATOR CUFF REPAIR Left     There were no vitals filed for this visit.   Subjective Assessment - 10/20/23 1652     Currently in Pain? No/denies    Pain  Score 0-No pain               Group Session:  S: Feeling pretty good today.   O: During today's OT group session, the patient participated in an educational segment about the importance of goal-setting and the application of the SMART framework to enhance daily life, particularly focusing on ADLs and iADLs. The session began with five open-ended pre-session questions that facilitated group discussion and introspection about their current relationship with goals. Following the introduction and educational segment, participants engaged in brainstorming and group discussions to devise hypothetical SMART goals. The session concluded with five post-session questions to reinforce understanding and facilitate reflection. Throughout the session, there was a range of engagement levels noted among the participants.   A:  Patient demonstrated a high level of engagement throughout the session. They actively participated in discussions, sharing personal experiences related to goal setting and challenges faced. Patient was able to clearly articulate an understanding of the SMART framework and proposed personal SMART goals related to their own ADLs with minimal assistance. They expressed enthusiasm about applying what they learned to their daily routine and appeared motivated to make changes.    P: Continue to attend PHP OT group sessions 5x week for 4 weeks to promote daily structure, social engagement, and opportunities to develop and utilize adaptive strategies to maximize functional performance in preparation for  safe transition and integration back into school, work, and the community. Plan to address topic of tbd in next OT group session.                    OT Education - 10/20/23 1653     Education Details Goal Setting              OT Short Term Goals - 10/02/23 1006       OT SHORT TERM GOAL #1   Title Patient will be educated on strategies to improve psychosocial  skills needed to participate fully in all daily, work, and leisure activities.    Time 4    Period Weeks    Status On-going    Target Date 11/01/23      OT SHORT TERM GOAL #2   Title Pt will apply psychosocial skills and coping mechanisms to daily activities in order to function independently and reintegrate into community dwelling    Status On-going      OT SHORT TERM GOAL #3   Title Pt will choose and/or engage in 1-3 socially engaging leisure activities to improve social participation skills upon reintegrating into community    Status On-going                      Plan - 10/20/23 1653     Occupational performance deficits (Please refer to evaluation for details): Rest and Sleep;Leisure;Social Participation    Psychosocial Skills Habits;Coping Strategies;Interpersonal Interaction;Routines and Behaviors             Patient will benefit from skilled therapeutic intervention in order to improve the following deficits and impairments:       Psychosocial Skills: Habits, Coping Strategies, Interpersonal Interaction, Routines and Behaviors   Visit Diagnosis: Difficulty coping    Problem List Patient Active Problem List   Diagnosis Date Noted   Dizziness 09/17/2023   Chronic radicular lumbar pain 09/17/2023   BPH (benign prostatic hyperplasia) 09/17/2023   Tobacco use 09/17/2023   MDD (major depressive disorder), recurrent episode, severe (HCC) 09/11/2023   S/P cervical spinal fusion 05/10/2021   Cervical spinal stenosis 05/03/2021   Spinal stenosis of cervical region 02/11/2021    Ted Mcalpine, OT 10/20/2023, 4:53 PM  Kerrin Champagne, OT   [facility] HOSPITALIZATION PROGRAM [address] [place], [place], [address] Phone: [phone]   Fax:  [phone]  Name: Derek Burns MRN: [id number] Date of Birth: 1965/11/06

## 2023-10-20 NOTE — Therapy (Signed)
[facility] PARTIAL HOSPITALIZATION PROGRAM [address] [place], [place], [address] Phone: [phone]   Fax:  [phone]  Occupational Therapy Treatment Virtual Visit via Video Note  I connected with Derek Burns on 10/20/23 at  8:00 AM EST by a video enabled telemedicine application and verified that I am speaking with the correct person using two identifiers.  Location: Patient: home Provider: office   I discussed the limitations of evaluation and management by telemedicine and the availability of in person appointments. The patient expressed understanding and agreed to proceed.    The patient was advised to call back or seek an in-person evaluation if the symptoms worsen or if the condition fails to improve as anticipated.  I provided 55 minutes of non-face-to-face time during this encounter.   Patient Details  Name: Derek Burns MRN: [id number] Date of Birth: 03/09/1966 No data recorded  Encounter Date: 10/16/2023   OT End of Session - 10/20/23 1630     Visit Number 15    Number of Visits 20    Date for OT Re-Evaluation 11/01/23    OT Start Time 1200    OT Stop Time 1255    OT Time Calculation (min) 55 min             Past Medical History:  Diagnosis Date   Complication of anesthesia    hard to wake up, low resting heart rate   DDD (degenerative disc disease), cervical    High cholesterol    Scoliosis     Past Surgical History:  Procedure Laterality Date   ANTERIOR CERVICAL DECOMP/DISCECTOMY FUSION N/A 05/03/2021   Procedure: C4-5, C5-6, C6-7 ANTERIOR CERVICAL DISCECTOMY FUSION, ALLOGRAFT AND PLATE;  Surgeon: Eldred Manges, MD;  Location: [facility] OR;  Service: Orthopedics;  Laterality: N/A;   HERNIA REPAIR     ROTATOR CUFF REPAIR Left     There were no vitals filed for this visit.   Subjective Assessment - 10/20/23 1628     Currently in Pain? No/denies    Pain Score 0-No pain                 Group  Session:  S: Feeling better today. Looking forward to the weekend   O: The objective of the telehealth group therapy session was to discuss the significance of routines in promoting mental health and wellbeing. The OT aimed to explore the power of routines in providing structure, stability, and predictability in individuals' lives, as well as their role in establishing healthy habits, reducing stress and anxiety, managing time effectively, achieving goals, and fostering a sense of community and social connectedness.   Participants were guided to identify areas where routines could improve their mental health and were provided with tips for establishing and maintaining healthy routines. The session concluded by emphasizing the potential of routines to enhance overall quality of life.    A: During the telehealth group therapy session, the patient actively participated in the discussion on the importance of routines in promoting mental health and wellbeing. The patient demonstrated a good understanding of the power of routines in providing structure, stability, and predictability in their life. They were able to identify areas in their life where routines could contribute to improving their overall mental health.  The patient's active participation, understanding of the concepts discussed, and their motivation to implement positive changes in their life, it can be assessed that they have a good potential to benefit from incorporating healthy routines into  their daily life.   The patient's commitment to reflecting on their current routines and selecting an area for improvement indicates a proactive approach to their mental health and wellbeing.    P: Continue to attend PHP OT group sessions 5x week for 4 weeks to promote daily structure, social engagement, and opportunities to develop and utilize adaptive strategies to maximize functional performance in preparation for safe transition and integration  back into school, work, and the community. Plan to address topic of tbd in next OT group session.                  OT Education - 10/20/23 1629     Education Details Routines 4/4    Person(s) Educated Patient              OT Short Term Goals - 10/02/23 1006       OT SHORT TERM GOAL #1   Title Patient will be educated on strategies to improve psychosocial skills needed to participate fully in all daily, work, and leisure activities.    Time 4    Period Weeks    Status On-going    Target Date 11/01/23      OT SHORT TERM GOAL #2   Title Pt will apply psychosocial skills and coping mechanisms to daily activities in order to function independently and reintegrate into community dwelling    Status On-going      OT SHORT TERM GOAL #3   Title Pt will choose and/or engage in 1-3 socially engaging leisure activities to improve social participation skills upon reintegrating into community    Status On-going                      Plan - 10/20/23 1630     Psychosocial Skills Habits;Coping Strategies;Interpersonal Interaction;Routines and Behaviors             Patient will benefit from skilled therapeutic intervention in order to improve the following deficits and impairments:       Psychosocial Skills: Habits, Coping Strategies, Interpersonal Interaction, Routines and Behaviors   Visit Diagnosis: Difficulty coping    Problem List Patient Active Problem List   Diagnosis Date Noted   Dizziness 09/17/2023   Chronic radicular lumbar pain 09/17/2023   BPH (benign prostatic hyperplasia) 09/17/2023   Tobacco use 09/17/2023   MDD (major depressive disorder), recurrent episode, severe (HCC) 09/11/2023   S/P cervical spinal fusion 05/10/2021   Cervical spinal stenosis 05/03/2021   Spinal stenosis of cervical region 02/11/2021    Ted Mcalpine, OT 10/20/2023, 4:31 PM  Kerrin Champagne, OT   [facility]  HOSPITALIZATION PROGRAM [address] [place], [place], [address] Phone: [phone]   Fax:  [phone]  Name: Derek Burns MRN: [id number] Date of Birth: 29-Apr-1966

## 2023-10-21 ENCOUNTER — Other Ambulatory Visit (HOSPITAL_COMMUNITY): Payer: PRIVATE HEALTH INSURANCE

## 2023-10-22 ENCOUNTER — Other Ambulatory Visit (HOSPITAL_COMMUNITY): Payer: PRIVATE HEALTH INSURANCE

## 2023-10-22 ENCOUNTER — Other Ambulatory Visit (HOSPITAL_COMMUNITY): Payer: Self-pay | Admitting: Licensed Clinical Social Worker

## 2023-10-22 DIAGNOSIS — F332 Major depressive disorder, recurrent severe without psychotic features: Secondary | ICD-10-CM

## 2023-10-23 ENCOUNTER — Other Ambulatory Visit (HOSPITAL_COMMUNITY): Payer: Self-pay

## 2023-10-23 ENCOUNTER — Other Ambulatory Visit (HOSPITAL_COMMUNITY): Payer: PRIVATE HEALTH INSURANCE | Admitting: Professional

## 2023-10-23 ENCOUNTER — Other Ambulatory Visit (HOSPITAL_COMMUNITY): Payer: Self-pay | Admitting: Family

## 2023-10-23 ENCOUNTER — Other Ambulatory Visit (HOSPITAL_COMMUNITY): Payer: PRIVATE HEALTH INSURANCE

## 2023-10-23 DIAGNOSIS — F332 Major depressive disorder, recurrent severe without psychotic features: Secondary | ICD-10-CM

## 2023-10-23 NOTE — Progress Notes (Signed)
  Virtual Visit via Video Note  I connected with Derek Burns on 10/23/23 at  9:00 AM EST by a video enabled telemedicine application and verified that I am speaking with the correct person using two identifiers.  Location: Patient: Home Provider: office   I discussed the limitations of evaluation and management by telemedicine and the availability of in person appointments. The patient expressed understanding and agreed to proceed.   I discussed the assessment and treatment plan with the patient. The patient was provided an opportunity to ask questions and all were answered. The patient agreed with the plan and demonstrated an understanding of the instructions.   The patient was advised to call back or seek an in-person evaluation if the symptoms worsen or if the condition fails to improve as anticipated.  I provided 15 minutes of non-face-to-face time during this encounter.   Derek Rack, NP   [facility] Partial hospitalization outpatient Program Discharge Summary  Derek Burns [id number]  Admission date: 09/29/2023 Discharge date: 10/23/2023  Reason for admission: Derek Burns 57 year old Caucasian male seen and evaluated via video teleassessment. He presents to start partial hospitalization programming. Reports he has been struggling with pain for the past 5+ years. 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   Progress in Program Toward Treatment Goals: Progressing patient attended and participated with daily group session when active and engaged participation.  Reported " I am getting my head back together." Denying any suicidal or homicidal ideations.  Denies auditory visual hallucinations at discharge.  Reports he has been taking his medications as indicated.  Recently changed all his medications to a new pharmacy due to insurance coverage.  Patient stepping down to  intensive outpatient programming 10/28/2022 start date  Progress (rationale): Stepping down to intensive outpatient programming -Continue medications as directed  Collaboration of Care: Other therapy session   Patient/Guardian was advised Release of Information must be obtained prior to any record release in order to collaborate their care with an outside provider. Patient/Guardian was advised if they have not already done so to contact the registration department to sign all necessary forms in order for [place] to release information regarding their care.   Consent: Patient/Guardian gives verbal consent for treatment and assignment of benefits for services provided during this visit. Patient/Guardian expressed understanding and agreed to proceed.   Derek Rack, NP 10/23/2023

## 2023-10-26 ENCOUNTER — Other Ambulatory Visit (HOSPITAL_COMMUNITY): Payer: PRIVATE HEALTH INSURANCE

## 2023-10-26 NOTE — Psych (Signed)
Virtual Visit via Video Note  I connected with Derek Burns on 10/23/23 at  9:00 AM EST by a video enabled telemedicine application and verified that I am speaking with the correct person using two identifiers.  Location: Patient: Home Provider: Clinical Home office   I discussed the limitations of evaluation and management by telemedicine and the availability of in person appointments. The patient expressed understanding and agreed to proceed.  Follow Up Instructions:    I discussed the assessment and treatment plan with the patient. The patient was provided an opportunity to ask questions and all were answered. The patient agreed with the plan and demonstrated an understanding of the instructions.   The patient was advised to call back or seek an in-person evaluation if the symptoms worsen or if the condition fails to improve as anticipated.  I provided 240 minutes of non-face-to-face time during this encounter.   Quinn Axe, [facility]   [facility] BH PHP THERAPIST PROGRESS NOTE  RODRIGO PEZZULLO [id number]  Session Time: 9:00-10:00  Participation Level: Active  Behavioral Response: CasualAlertEuthymic  Type of Therapy: Group Therapy  Treatment Goals addressed: Coping  Progress Towards Goals: Progressing  Interventions: CBT, DBT, Solution Focused, Strength-based, Supportive, and Reframing  Summary: Derek Burns is a 57 y.o. male who presents with depression and anxiety symptoms. Clinician led check-in regarding current stressors and situation, and review of patient completed daily inventory. Clinician utilized active listening and empathetic response and validated patient emotions. Clinician facilitated processing group on pertinent issues.?    Therapist Response: Patient arrived within time allowed. Patient rates his mood at an 8 on a scale of 1-10 with 10 being best. Pt states he feels "pretty good." Pt states he slept 7/8 hours and ate 3x. Pt reports he is in a  lot of physical pain due to the cold weather and rain. He shares he feels good about today being his last day of PHP and hopeful for the future. Patient able to process. Patient engaged in discussion.     Session Time: 10:00 am - 11:00 am   Participation Level: Active   Behavioral Response: CasualAlertDepressed   Type of Therapy: Group Therapy   Treatment Goals addressed: Coping   Progress Towards Goals: Progressing   Interventions: CBT, DBT, Solution Focused, Strength-based, Supportive, and Reframing   Therapist Response: Cln led discussion on intrusive thoughts. Cln shares people are not responsible for their first thought, but their second and the actions following the thoughts. Cln and group discussed separating thoughts from actions. Cln and group discussed life being about choices and how that can be empowering. Cln and group discussed importance of focusing on what is within one's control, instead of what is outside of one's control. Therapist Response: Pt able to process and provide support to group. Pt shares he reminds himself that the "what ifs can go both ways." And provides examples of how our brains can lead [place] down a "negative what-if path" and counter balancing with the "positive what-ifs."      Session Time: 11:00 -12:00   Participation Level: Active   Behavioral Response: CasualAlertDepressed   Type of Therapy: Group Therapy   Treatment Goals addressed: Coping   Progress Towards Goals: Progressing   Interventions: CBT, DBT, Solution Focused, Strength-based, Supportive, and Reframing   Summary: Clinician introduced topic of "Positive Psychology". Group watched "Positive Psychology" Ted-Talk. Patients discussed how their "lens" of life effects the way they feel.     Therapist Response: Pt engaged in discussion.  Pt reports connecting with how powerful the mind is and understanding applying positive psychology techniques can help.        Session Time: 12:00  -1:00   Participation Level: Active   Behavioral Response: CasualAlertDepressed   Type of Therapy: Group Therapy   Treatment Goals addressed: Coping   Progress Towards Goals: Progressing   Interventions: CBT, DBT, Solution Focused, Strength-based, Supportive, and Reframing   Summary: 12:00-12:50: Clinician continued with topic of Positive Psychology. Patients participated in trying 5 different ways to change their "lens" to a more positive outlook. Patients identified 1 of the strategies they would be willing to try to change their "lens." 12:50 - 1:00 Clinician assessed for immediate needs, medication compliance and efficacy, and safety concerns.   Therapist Response: Pt engaged in discussion. Pt reports he will write down 3 gratitudes each day to help change his lens.     Suicidal/Homicidal: Nowithout intent/plan    Plan: Patient will discharge from PHP due to meeting treatment goals of decreasing depression and anxiety symptoms, decreasing SI, and increasing coping abilities. Progress was measured by observation, self-report, and scales. Provider has approved discharge and patient reports alignment with discharge plan. Patient will step down to IOP within this agency. Patient is scheduled to start IOP on Thursday, 10/29/23. Patient denies any SI/HI at time of discharge.  Collaboration of Care: Medication Management AEB Hillery Jacks  Patient/Guardian was advised Release of Information must be obtained prior to any record release in order to collaborate their care with an outside provider. Patient/Guardian was advised if they have not already done so to contact the registration department to sign all necessary forms in order for [place] to release information regarding their care.   Consent: Patient/Guardian gives verbal consent for treatment and assignment of benefits for services provided during this visit. Patient/Guardian expressed understanding and agreed to proceed.    Diagnosis: Severe episode of recurrent major depressive disorder, without psychotic features (HCC) [F33.2]    1. Severe episode of recurrent major depressive disorder, without psychotic features [facility])       Quinn Axe, [facility] 10/23/23

## 2023-10-27 ENCOUNTER — Other Ambulatory Visit (HOSPITAL_COMMUNITY): Payer: PRIVATE HEALTH INSURANCE

## 2023-10-27 ENCOUNTER — Telehealth (HOSPITAL_COMMUNITY): Payer: Self-pay | Admitting: Psychiatry

## 2023-10-27 NOTE — Psych (Signed)
Virtual Visit via Video Note  I connected with [name] on 10/16/23 at  9:00 AM EST by a video enabled telemedicine application and verified that I am speaking with the correct person using two identifiers.  Location: Patient: patient home Provider: clinical home office   I discussed the limitations of evaluation and management by telemedicine and the availability of in person appointments. The patient expressed understanding and agreed to proceed.  I discussed the assessment and treatment plan with the patient. The patient was provided an opportunity to ask questions and all were answered. The patient agreed with the plan and demonstrated an understanding of the instructions.   The patient was advised to call back or seek an in-person evaluation if the symptoms worsen or if the condition fails to improve as anticipated.  Pt was provided 240 minutes of non-face-to-face time during this encounter.   Donia Guiles, LCSW   [facility] BH PHP THERAPIST PROGRESS NOTE  SHAFFER CARRY [id number]  Session Time: 9:00 - 10:00  Participation Level: Active  Behavioral Response: CasualAlertDepressed  Type of Therapy: Group Therapy  Treatment Goals addressed: Coping  Progress Towards Goals: Progressing  Interventions: CBT, DBT, Supportive, and Reframing  Summary: SHANGA SHAGENA is a 57 y.o. male who presents with depression symptoms.  Clinician led check-in regarding current stressors and situation, and review of patient completed daily inventory. Clinician utilized active listening and empathetic response and validated patient emotions. Clinician facilitated processing group on pertinent issues.?    Therapist Response:  Patient arrived within time allowed. Patient rates his mood at a 8 on a scale of 1-10 with 10 being best. Pt states he feels "pretty good." Pt states he slept 9 hours and ate 3x. Pt reports his appetite returned yesterday. Pt states he spent time outside with  his puppy yesterday and otherwise stayed at home. Pt reports concern re: bad weather this week and how it will affect his pain. Patient able to process. Patient engaged in discussion.          Session Time: 10:00 am - 11:00 am   Participation Level: Active   Behavioral Response: CasualAlertDepressed   Type of Therapy: Group Therapy   Treatment Goals addressed: Coping   Progress Towards Goals: Progressing   Interventions: CBT, DBT, Solution Focused, Strength-based, Supportive, and Reframing   Summary:  Cln led discussion on ways to manage stressors and feelings over the weekend. Group members  brainstormed things to do over the weekend for multiple levels of energy, access, and moods. Cln reviewed crisis services should they be needed and provided pt's with the text crisis line, mobile crisis, national suicide hotline, [facility] 24/7 line, and information on [facility].      Therapist Response: Pt engaged in discussion and is able to identify 3 ideas of what to do over the weekend to keep their mind engaged.           Session Time: 11:00 -12:00   Participation Level: Active   Behavioral Response: CasualAlertDepressed   Type of Therapy: Group Therapy   Treatment Goals addressed: Coping   Progress Towards Goals: Progressing   Interventions: CBT, DBT, Solution Focused, Strength-based, Supportive, and Reframing   Summary: Cln introduced grounding technique of 18299 and led practice with the group. Cln discussed the ways in which grounding can be helpful with different feelings and how to utilize the skill.    Therapist Response:  Pt engaged in practice and reports understanding of skill.  Session Time: 12:00 -1:00   Participation Level: Active   Behavioral Response: CasualAlertDepressed   Type of Therapy: Group therapy   Treatment Goals addressed: Coping   Progress Towards Goals: Progressing   Interventions: OT group   Summary: 12:00 - 12:50: Occupational  Therapy group with cln E. Hollan.  12:50 - 1:00 Clinician assessed for immediate needs, medication compliance and efficacy, and safety concerns.   Therapist Response: 12:00 - 12:50: See note 12:50 - 1:00 pm: At check-out, patient reports no immediate concerns. Patient demonstrates progress as evidenced by continued engagement and responsiveness to treatment. Patient denies SI/HI/self-harm thoughts at the end of group.    Suicidal/Homicidal: Nowithout intent/plan  Plan: Pt will continue in PHP while working to decrease depression symptoms, increase emotion regulation, and increase ability to manage symptoms in a healthy manner.   Collaboration of Care: Medication Management AEB T Lewis, NP  Patient/Guardian was advised Release of Information must be obtained prior to any record release in order to collaborate their care with an outside provider. Patient/Guardian was advised if they have not already done so to contact the registration department to sign all necessary forms in order for [place] to release information regarding their care.   Consent: Patient/Guardian gives verbal consent for treatment and assignment of benefits for services provided during this visit. Patient/Guardian expressed understanding and agreed to proceed.   Diagnosis: Severe episode of recurrent major depressive disorder, without psychotic features (HCC) [F33.2]    1. Severe episode of recurrent major depressive disorder, without psychotic features (HCC) [F33.2]       Donia Guiles, LCSW

## 2023-10-27 NOTE — Psych (Signed)
Virtual Visit via Video Note  I connected with Derek Burns on 10/19/23 at  9:00 AM EST by a video enabled telemedicine application and verified that I am speaking with the correct person using two identifiers.  Location: Patient: patient home Provider: clinical home office   I discussed the limitations of evaluation and management by telemedicine and the availability of in person appointments. The patient expressed understanding and agreed to proceed.  I discussed the assessment and treatment plan with the patient. The patient was provided an opportunity to ask questions and all were answered. The patient agreed with the plan and demonstrated an understanding of the instructions.   The patient was advised to call back or seek an in-person evaluation if the symptoms worsen or if the condition fails to improve as anticipated.  Pt was provided 240 minutes of non-face-to-face time during this encounter.   Donia Guiles, LCSW   [facility] BH PHP THERAPIST PROGRESS NOTE  Derek Burns [id number]  Session Time: 9:00 - 10:00  Participation Level: Active  Behavioral Response: CasualAlertDepressed  Type of Therapy: Group Therapy  Treatment Goals addressed: Coping  Progress Towards Goals: Progressing  Interventions: CBT, DBT, Supportive, and Reframing  Summary: Derek Burns is a 57 y.o. male who presents with depression symptoms.  Clinician led check-in regarding current stressors and situation, and review of patient completed daily inventory. Clinician utilized active listening and empathetic response and validated patient emotions. Clinician facilitated processing group on pertinent issues.?    Therapist Response:  Patient arrived within time allowed. Patient rates his mood at a 7.5 on a scale of 1-10 with 10 being best. Pt states he feels "okay." Pt states he slept 8 hours and ate 3x. Pt reports he had a busy weekend and may have pushed it too hard because he has a  headache and body aches today. Pt reports he had a family event for the holiday fri, sat, and sun. Pt states they went well and he was able to step away by himself when he became overwhelmed. Pt reports continued difficulty accepting limits and feeling frustrated over the weekend that his activity level wore him out when it was so much lower than his previous activity level. Patient able to process. Patient engaged in discussion.          Session Time: 10:00 am - 11:00 am   Participation Level: Active   Behavioral Response: CasualAlertDepressed   Type of Therapy: Group Therapy   Treatment Goals addressed: Coping   Progress Towards Goals: Progressing   Interventions: CBT, DBT, Solution Focused, Strength-based, Supportive, and Reframing   Summary: Cln led processing group for pt's current struggles. Group members shared stressors and provided support and feedback. Cln brought in topics of boundaries, healthy relationships, and unhealthy thought processes to inform discussion.    Therapist Response: Pt able to process and provide support to group.            Session Time: 11:00 -12:00   Participation Level: Active   Behavioral Response: CasualAlertDepressed   Type of Therapy: Group Therapy   Treatment Goals addressed: Coping   Progress Towards Goals: Progressing   Interventions: CBT, DBT, Solution Focused, Strength-based, Supportive, and Reframing   Summary: Cln led discussion on personal standards and they way in which it impacts the way we view ourselves and our abilities. Group members discussed judgment, struggles, and barriers they experience in terms of personal standards. Cln brought in topics of balance, grace, and kindness. Cln proposed the "best  friend test" as a way to calibrate whether we are viewing our situation with kindness or harshness.    Therapist Response:  Pt engaged in discussion and reports willingness to utilize the best friend test.         Session Time: 12:00 -1:00   Participation Level: Active   Behavioral Response: CasualAlertDepressed   Type of Therapy: Group therapy   Treatment Goals addressed: Coping   Progress Towards Goals: Progressing   Interventions: OT group   Summary: 12:00 - 12:50: Occupational Therapy group with cln E. Hollan.  12:50 - 1:00 Clinician assessed for immediate needs, medication compliance and efficacy, and safety concerns.   Therapist Response: 12:00 - 12:50: See note 12:50 - 1:00 pm: At check-out, patient reports no immediate concerns. Patient demonstrates progress as evidenced by continued engagement and responsiveness to treatment. Patient denies SI/HI/self-harm thoughts at the end of group.    Suicidal/Homicidal: Nowithout intent/plan  Plan: Pt will continue in PHP while working to decrease depression symptoms, increase emotion regulation, and increase ability to manage symptoms in a healthy manner.   Collaboration of Care: Medication Management AEB T Lewis, NP  Patient/Guardian was advised Release of Information must be obtained prior to any record release in order to collaborate their care with an outside provider. Patient/Guardian was advised if they have not already done so to contact the registration department to sign all necessary forms in order for [place] to release information regarding their care.   Consent: Patient/Guardian gives verbal consent for treatment and assignment of benefits for services provided during this visit. Patient/Guardian expressed understanding and agreed to proceed.   Diagnosis: Severe episode of recurrent major depressive disorder, without psychotic features (HCC) [F33.2]    1. Severe episode of recurrent major depressive disorder, without psychotic features (HCC) [F33.2]       Donia Guiles, LCSW

## 2023-10-27 NOTE — Telephone Encounter (Signed)
D:  Pt will be stepping down to virtual MH-IOP from virtual PHP on 10-29-23.  A:  Placed call and oriented pt to MH-IOP.  Encouraged pt to verify his benefits through his insurance company.  R:  Pt receptive.

## 2023-10-27 NOTE — Psych (Signed)
Virtual Visit via Video Note  I connected with Derek Burns on 10/20/23 at  9:00 AM EST by a video enabled telemedicine application and verified that I am speaking with the correct person using two identifiers.  Location: Patient: patient home Provider: clinical home office   I discussed the limitations of evaluation and management by telemedicine and the availability of in person appointments. The patient expressed understanding and agreed to proceed.  I discussed the assessment and treatment plan with the patient. The patient was provided an opportunity to ask questions and all were answered. The patient agreed with the plan and demonstrated an understanding of the instructions.   The patient was advised to call back or seek an in-person evaluation if the symptoms worsen or if the condition fails to improve as anticipated.  Pt was provided 240 minutes of non-face-to-face time during this encounter.   Donia Guiles, LCSW   [facility] PHP THERAPIST PROGRESS NOTE  Derek Burns [id number]  Session Time: 9:00 - 10:00  Participation Level: Active  Behavioral Response: CasualAlertDepressed  Type of Therapy: Group Therapy  Treatment Goals addressed: Coping  Progress Towards Goals: Progressing  Interventions: CBT, DBT, Supportive, and Reframing  Summary: Derek Burns is a 58 y.o. male who presents with depression symptoms.  Clinician led check-in regarding current stressors and situation, and review of patient completed daily inventory. Clinician utilized active listening and empathetic response and validated patient emotions. Clinician facilitated processing group on pertinent issues.?    Therapist Response:  Patient arrived within time allowed. Patient rates his mood at a 7.5 on a scale of 1-10 with 10 being best. Pt states he feels "restless." Pt states he slept 8.5 hours and ate 2x. Pt reports yesterday went well and he stayed around the house. Pt reports feeling  anxious about today because he has multiple things to do after group. Pt reports he is antsy to make sure they all get done. Patient able to process. Patient engaged in discussion.          Session Time: 10:00 am - 11:00 am   Participation Level: Active   Behavioral Response: CasualAlertDepressed   Type of Therapy: Group Therapy   Treatment Goals addressed: Coping   Progress Towards Goals: Progressing   Interventions: CBT, DBT, Solution Focused, Strength-based, Supportive, and Reframing   Summary: Cln led discussion on family dynamics and the way in which they impact [place]. Group members shared struggles with their families and the way the patterns of behaviors have negatively impacted them. Cln provided space to process and validated pt's experiences.    Therapist Response: Pt engaged in discussion and is able to identify current struggles in their family dynamics. Pt able to process.           Session Time: 11:00 -12:00   Participation Level: Active   Behavioral Response: CasualAlertDepressed   Type of Therapy: Group Therapy   Treatment Goals addressed: Coping   Progress Towards Goals: Progressing   Interventions: CBT, DBT, Solution Focused, Strength-based, Supportive, and Reframing   Summary: Cln led discussion on the holidays and held space for group members to process plans, feelings, and worries regarding the upcoming holiday. Cln brought in DBT ACCEPTS skills, boundaries, CBT thought challenging, and planning ahead to inform discussion when appropriate.    Therapist Response:  Pt engaged in discussion is able to share and provide support.        Session Time: 12:00 -1:00   Participation Level: Active   Behavioral Response:  CasualAlertDepressed   Type of Therapy: Group therapy   Treatment Goals addressed: Coping   Progress Towards Goals: Progressing   Interventions: CBT, DBT, Solution Focused, Strength-based, Supportive, and Reframing   Summary: 12:00  - 12:50: Cln introduced DBT distress tolerance skill IMPROVE.  Cln discussed how this set of skills are for when you have to sit through an undesirable feeling and wait for it to pass. Group discussed how to apply the IMPROVE skills to decrease distress at the undesired feeling.  12:50 - 1:00 Clinician assessed for immediate needs, medication compliance and efficacy, and safety concerns.   Therapist Response: 12:00 - 12:50:  Pt engaged in discussion and is able to identify ways to apply the skill.  12:50 - 1:00 pm: At check-out, patient reports no immediate concerns. Patient demonstrates progress as evidenced by continued engagement and responsiveness to treatment. Patient denies SI/HI/self-harm thoughts at the end of group.    Suicidal/Homicidal: Nowithout intent/plan  Plan: Pt will continue in PHP while working to decrease depression symptoms, increase emotion regulation, and increase ability to manage symptoms in a healthy manner.   Collaboration of Care: Medication Management AEB T Lewis, NP  Patient/Guardian was advised Release of Information must be obtained prior to any record release in order to collaborate their care with an outside provider. Patient/Guardian was advised if they have not already done so to contact the registration department to sign all necessary forms in order for [place] to release information regarding their care.   Consent: Patient/Guardian gives verbal consent for treatment and assignment of benefits for services provided during this visit. Patient/Guardian expressed understanding and agreed to proceed.   Diagnosis: Severe episode of recurrent major depressive disorder, without psychotic features (HCC) [F33.2]    1. Severe episode of recurrent major depressive disorder, without psychotic features (HCC) [F33.2]       Donia Guiles, LCSW

## 2023-10-28 ENCOUNTER — Ambulatory Visit (HOSPITAL_COMMUNITY): Payer: Self-pay

## 2023-10-28 ENCOUNTER — Other Ambulatory Visit (HOSPITAL_COMMUNITY): Payer: BC Managed Care – PPO

## 2023-10-29 ENCOUNTER — Ambulatory Visit (HOSPITAL_COMMUNITY): Payer: Self-pay

## 2023-10-29 ENCOUNTER — Encounter (HOSPITAL_COMMUNITY): Payer: Self-pay

## 2023-10-29 ENCOUNTER — Encounter (HOSPITAL_COMMUNITY): Payer: Self-pay | Admitting: Psychiatry

## 2023-10-29 ENCOUNTER — Other Ambulatory Visit (HOSPITAL_COMMUNITY): Payer: BC Managed Care – PPO

## 2023-10-29 ENCOUNTER — Other Ambulatory Visit (HOSPITAL_COMMUNITY): Payer: PRIVATE HEALTH INSURANCE | Attending: Psychiatry | Admitting: Psychiatry

## 2023-10-29 DIAGNOSIS — R4589 Other symptoms and signs involving emotional state: Secondary | ICD-10-CM | POA: Diagnosis not present

## 2023-10-29 DIAGNOSIS — F419 Anxiety disorder, unspecified: Secondary | ICD-10-CM | POA: Diagnosis not present

## 2023-10-29 DIAGNOSIS — F332 Major depressive disorder, recurrent severe without psychotic features: Secondary | ICD-10-CM | POA: Diagnosis present

## 2023-10-29 NOTE — Progress Notes (Signed)
 Virtual Visit via Video Note   I connected with Ferdie BIRCH. Gattuso on 10/29/23 at  9:00 AM EDT by a video enabled telemedicine application and verified that I am speaking with the correct person using two identifiers.   At orientation to the IOP program, Case Manager discussed the limitations of evaluation and management by telemedicine and the availability of in person appointments. The patient expressed understanding and agreed to proceed with virtual visits throughout the duration of the program.   Location:  Patient: Patient Home Provider: OPT BH Office   History of Present Illness: MDD   Observations/Objective: Check In: Case Manager checked in with all participants to review discharge dates, insurance authorizations, work-related documents and needs from the treatment team regarding medications. Silvester stated needs and engaged in discussion.    Initial Therapeutic Activity: Counselor facilitated a check-in with Daion to assess for safety, sobriety and medication compliance.  Counselor also inquired about Coty's current emotional ratings, as well as any significant changes in thoughts, feelings or behavior since previous check in.  Silvano presented for session on time and was alert, oriented x5, with no evidence or self-report of active SI/HI or A/V H.  Ajene reported compliance with medication and denied use of alcohol or illicit substances.  Quintyn reported scores of 4/10 for depression, 5/10 for anxiety, and 0/10 for anger/irritability.  Burtis denied any recent outbursts or panic attacks.  Quintavious reported that a recent success was enjoying his New Years Eve.  Jafeth reported that a struggle was getting a call last night that his stepfather is in intensive care after falling down.  Brittin reported that his goal today is to wait for updates on his condition since he may need to travel to visit.        Second Therapeutic Activity: Counselor introduced topic of stress management  today.  Counselor provided definition of stress as feeling tense, overwhelmed, worn out, and/or exhausted, and noted that in small amounts, stress can be motivating until things become too overwhelming to manage.  Counselor also explained how stress can be acute (brief but intense) or chronic (long-lasting) and this can impact the severity of symptoms one can experience in the physical, emotional, and behavioral categories.  Counselor inquired about members' specific stressors, how long they have been prevalent, and the various symptoms that tend to manifest as a result.  Counselor also offered several stress management strategies to help improve members' coping ability, including journaling, gratitude practice, relaxation techniques, and time management tips.  Counselor also explained that research has shown a strong support network composed of trusted family, friends, or community members can increase resilience in times of stress, and inquired about who members can reach out to for help in managing stressors.  Counselor encouraged members to consider discussing stressor 'red flags' with their close supports that can be monitored and strategies for assisting them in times of crisis.  Intervention was effective, as evidenced by Ferdie actively participating in discussion on subject, reporting that his most significant stressors include managing physical pain, fatigue, work stress, and money worries.  Serjio was able to identify several warning signs related to stress, including lack of appetite, headaches, compulsive smoking, and restlessness.  Brae also expressed receptiveness to several stress management strategies practiced today in session, including deep breathing, setting healthier work boundaries, and progressive muscle relaxation.    Assessment and Plan: Counselor recommends that [place] remain in IOP treatment to better manage mental health symptoms, ensure stability and pursue completion of  treatment  plan goals. Counselor recommends adherence to crisis/safety plan, taking medications as prescribed, and following up with medical professionals if any issues arise.    Follow Up Instructions: Counselor will send Webex link for session tomorrow.  Gokul was advised to call back or seek an in-person evaluation if the symptoms worsen or if the condition fails to improve as anticipated.   Collaboration of Care:   Medication Management AEB [name], NP                                          Case Manager AEB [name], CNA    Patient/Guardian was advised Release of Information must be obtained prior to any record release in order to collaborate their care with an outside provider. Patient/Guardian was advised if they have not already done so to contact the registration department to sign all necessary forms in order for us  to release information regarding their care.    Consent: Patient/Guardian gives verbal consent for treatment and assignment of benefits for services provided during this visit. Patient/Guardian expressed understanding and agreed to proceed.   I provided 180 minutes of non-face-to-face time during this encounter.   [name], LCSW, LCAS 10/29/23

## 2023-10-29 NOTE — Progress Notes (Signed)
 Virtual Visit via Video Note  I connected with Derek Burns on @TODAY @ at  9:00 AM EST by a video enabled telemedicine application and verified that I am speaking with the correct person using two identifiers.  Location: Patient: at home Provider: at office   I discussed the limitations of evaluation and management by telemedicine and the availability of in person appointments. The patient expressed understanding and agreed to proceed.   I discussed the assessment and treatment plan with the patient. The patient was provided an opportunity to ask questions and all were answered. The patient agreed with the plan and demonstrated an understanding of the instructions.   The patient was advised to call back or seek an in-person evaluation if the symptoms worsen or if the condition fails to improve as anticipated.  I provided 30 minutes of non-face-to-face time during this encounter.   GRETTA LEVAN, M.Ed,CNA   Patient ID: Derek Burns, male   DOB: Oct 11, 1966, 58 y.o.   MRN: [id number] As per previous CCA states:  Derek Burns is a 58yo male referred to [facility] by Dr. Barbra due to depression related to severe chronic pain. He cites his stressor as chronic pain and reports passive SI over the past month, but denies SI at this time. He cites his faith as a protective factor. He reports he has been struggling with depression for the past couple of years. He endorses normal ADLs and denies previous mental health tx, hospitalizations, suicide attempts, diagnoses, NSSI, family history, HI, AVH, substance use, and history of substance abuse. He cites his wife as his support, with whom he currently lives. He reports he has spinal stenosis, scoliosis, degenerative disc disease, and arthritis and also states he has had three cervical discs removed and numerous injections. He rates his current pain level at a 9 on a scale from 1-10 with 10 being the worst. He states he has firearms in his home and  they are in a safe that he reports cannot readily access at this time due to the code not working. Cln provides education about suicide risk and having firearms in the home and pt verbalizes understanding. He is recommended for PHP due to recent SI, depressive symptoms, severe chronic pain, and access to means, all of which increase his risk for dying by suicide.  Patient stepped down from virtual PHP to virtual MH-IOP today.  Reports feeling helpless today d/t a phone call he received yesterday from his mother re: stepfather.  My stepfather and mother live a thousand miles away and apparently on Monday he had an episode in which he fell out.  Pt states tests are being ran, but they don't know what happened to him.  According to pt, he has had one prior episode. Pt states other than that happening, his New Year's Eve/Day was fine.  On a scale of 1-10; pt rates his depression at a 3 and anxiety at a 4.  Denies SI/HI or A/V hallucinations.  A:  Oriented pt.  F/U with Dr. Franky Molt at [facility].  Encouraged pt to verify his insurance benefits with [employer].     Pt was advised of ROI must be obtained prior to any records release in order to collaborate his care with an outside provider.  Pt was advised if he has not already done so to contact the front desk to sign all necessary forms in order for MH-IOP to release info re: his care.  Consent:  Pt gives verbal consent for tx and  assignment of benefits for services provided during this telehealth group process.  Pt expressed understanding and agreed to proceed. Collaboration of care:  Collaborate with Dr. Laurann HAIR,  [name], NP AEB; Dr. Franky Molt AEB; [name], LCSW AEB,   Encouraged support groups through [facility].  Pt will improve his mood as evidenced by being happy again, managing her mood and coping with daily stressors for 5 out of 7 days for 60 days.  R:  Pt receptive.        [name], M.Ed,  CNA

## 2023-10-30 ENCOUNTER — Other Ambulatory Visit (HOSPITAL_COMMUNITY): Payer: Self-pay

## 2023-10-30 ENCOUNTER — Other Ambulatory Visit (HOSPITAL_COMMUNITY): Payer: PRIVATE HEALTH INSURANCE | Admitting: Family

## 2023-10-30 ENCOUNTER — Other Ambulatory Visit (HOSPITAL_COMMUNITY): Payer: BC Managed Care – PPO

## 2023-10-30 DIAGNOSIS — F332 Major depressive disorder, recurrent severe without psychotic features: Secondary | ICD-10-CM | POA: Diagnosis not present

## 2023-10-30 DIAGNOSIS — R4589 Other symptoms and signs involving emotional state: Secondary | ICD-10-CM

## 2023-10-30 NOTE — Progress Notes (Signed)
 Virtual Visit via Video Note   I connected with Derek Burns on 10/30/23 at  9:00 AM EDT by a video enabled telemedicine application and verified that I am speaking with the correct person using two identifiers.   At orientation to the IOP program, Case Manager discussed the limitations of evaluation and management by telemedicine and the availability of in person appointments. The patient expressed understanding and agreed to proceed with virtual visits throughout the duration of the program.   Location:  Patient: Patient Home Provider: Home Office   History of Present Illness: MDD   Observations/Objective: Check In: Case Manager checked in with all participants to review discharge dates, insurance authorizations, work-related documents and needs from the treatment team regarding medications. Derek Burns stated needs and engaged in discussion.    Initial Therapeutic Activity: Counselor facilitated a check-in with Derek Burns to assess for safety, sobriety and medication compliance.  Counselor also inquired about Derek Burns's current emotional ratings, as well as any significant changes in thoughts, feelings or behavior since previous check in.  Derek Burns presented for session on time and was alert, oriented x5, with no evidence or self-report of active SI/HI or A/V H.  Derek Burns reported compliance with medication and denied use of alcohol or illicit substances.  Derek Burns reported scores of 1/10 for depression, 1/10 for anxiety, and 0/10 for anger/irritability.  Derek Burns denied any recent outbursts or panic attacks.  Derek Burns reported that an ongoing struggle has been worrying about his stepfather, who was hospitalized.  Derek Burns reported that his goal this weekend is to go out with his family to see a play downtown.        Second Therapeutic Activity: Counselor discussed topic of sleep hygiene today with group.  Counselor defined this as the habits, behaviors and environmental factors that can be adjusted to  improve overall sleep quality.  Counselor also discussed how lack of consistent sleep can negatively affect mood and overall mental health.  Counselor provided members with a screening to complete assessing typical barriers one might face in achieving quality sleep at night (i.e. struggling to get up in the morning, waking up throughout the night, tossing/turning, etc), and inquired about members' perception of sleep hygiene at present.  Counselor offered techniques to members via a virtual handout which could be implemented in order to improve sleep hygiene, such as sticking to a normal morning/night routine, avoiding using of electronics too close to bedtime, avoiding heavy meals before bed, using a sleep journal to track changes and address anxious thoughts, as well as avoiding naps during the daytime, ensuring proper use of medications if prescribed any by provider(s), and more.  Counselor inquired about changes members intend to make to sleep hygiene based upon information covered today.  Interventions were effective, as evidenced by Derek actively participating in discussion on subject, reporting that he feels like he gets at least 8 hours on average most nights, but there are times when he sits down and can fall asleep again very easily during the daytime if not preoccupied.  Derek Burns reported that when he was hospitalized recently, they did notice he had some signs of sleep apnea during monitoring.  Derek Burns also completed a sleep assessment today, which revealed that he is getting an adequate level of sleep at present.  Derek Burns reported that he plans to improve sleep hygiene over the course of treatment by talking with his provider about getting a sleep study scheduled to determine if any underlying health conditions might negatively impact overall sleep quality.  Assessment and Plan: Counselor recommends that [place] remain in IOP treatment to better manage mental health symptoms, ensure stability  and pursue completion of treatment plan goals. Counselor recommends adherence to crisis/safety plan, taking medications as prescribed, and following up with medical professionals if any issues arise.    Follow Up Instructions: Counselor will send Webex link for session tomorrow.  Derek Burns was advised to call back or seek an in-person evaluation if the symptoms worsen or if the condition fails to improve as anticipated.   Collaboration of Care:   Medication Management AEB [name], NP                                          Case Manager AEB [name], CNA    Patient/Guardian was advised Release of Information must be obtained prior to any record release in order to collaborate their care with an outside provider. Patient/Guardian was advised if they have not already done so to contact the registration department to sign all necessary forms in order for us  to release information regarding their care.    Consent: Patient/Guardian gives verbal consent for treatment and assignment of benefits for services provided during this visit. Patient/Guardian expressed understanding and agreed to proceed.   I provided 180 minutes of non-face-to-face time during this encounter.   [name], LCSW, LCAS 10/30/23

## 2023-10-30 NOTE — Progress Notes (Signed)
 Virtual Visit via Video Note  I connected with Derek Burns on 10/30/23 at  9:00 AM EST by a video enabled telemedicine application and verified that I am speaking with the correct person using two identifiers.  Location: Patient: Home  Provider: Office   I discussed the limitations of evaluation and management by telemedicine and the availability of in person appointments. The patient expressed understanding and agreed to proceed.   I discussed the assessment and treatment plan with the patient. The patient was provided an opportunity to ask questions and all were answered. The patient agreed with the plan and demonstrated an understanding of the instructions.   The patient was advised to call back or seek an in-person evaluation if the symptoms worsen or if the condition fails to improve as anticipated.  I provided 10 minutes of non-face-to-face time during this encounter.   Staci LOISE Kerns, NP    Psychiatric Initial Adult Assessment   Patient Identification: Derek Burns MRN:  [id number] Date of Evaluation:  10/30/2023 Referral Source: Stepping down from  partial hospitalization program Chief Complaint:  worsening  depression   Visit Diagnosis:    ICD-10-CM   1. Severe episode of recurrent major depressive disorder, without psychotic features (HCC)  F33.2     2. Difficulty coping  R45.89       History of Present Illness: Derek Burns 58 year old Caucasian male recently completed partial hospitalization programming.  Has a documented history related to depression and anxiety and chronic pain.  Reports overall his mood has stabilized since attending the program.  States his appetite has improved.  Currently prescribed Cymbalta  and hydroxyzine for mood stabilization.  Rating his depression and anxiety 3 out of 10 with 10 being the worst.  No concerns related to suicidal or homicidal ideations.  Patient to start intensive outpatient programming on 10-29-23.  Per  initial admission assessment note: Derek Burns 58 year old Caucasian male seen and evaluated via video teleassessment. He presents to start partial hospitalization programming. Reports he has been struggling with pain for the past 5+ years. 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    During evaluation Derek Burns is sitting in his living room; he is alert/oriented x 4; calm/cooperative; and mood congruent with affect.  Patient is speaking in a clear tone at moderate volume, and normal pace; with good eye contact. His thought process is coherent and relevant; There is no indication that he is currently responding to internal/external stimuli or experiencing delusional thought content.  Patient denies suicidal/self-harm/homicidal ideation, psychosis, and paranoia.  Patient has remained calm throughout assessment and has answered questions appropriately.   Associated Signs/Symptoms: Depression Symptoms:   (Hypo) Manic Symptoms:  Distractibility, Anxiety Symptoms:  Excessive Worry, Psychotic Symptoms:  Hallucinations: None PTSD Symptoms: NA  Past Psychiatric History: Major depressive disorder, generalized anxiety disorder currently followed by pain management  Previous Psychotropic Medications: Yes   Substance Abuse History in the last 12 months:  No.  Consequences of Substance Abuse: NA  Past Medical History:  Past Medical History:  Diagnosis Date   Complication of anesthesia    hard to wake up, low resting heart rate   DDD (degenerative disc disease), cervical    High cholesterol    Scoliosis     Past Surgical History:  Procedure Laterality Date   ANTERIOR CERVICAL DECOMP/DISCECTOMY FUSION N/A 05/03/2021   Procedure: C4-5, C5-6, C6-7 ANTERIOR CERVICAL DISCECTOMY FUSION, ALLOGRAFT AND PLATE;  Surgeon: Barbarann,  Oneil BROCKS, MD;  Location: [facility] OR;  Service: Orthopedics;  Laterality: N/A;   HERNIA REPAIR      ROTATOR CUFF REPAIR Left     Family Psychiatric History: See chart  Family History: No family history on file.  Social History:   Social History   Socioeconomic History   Marital status: Married    Spouse name: Not on file   Number of children: 1   Years of education: Not on file   Highest education level: Some college, no degree  Occupational History   Not on file  Tobacco Use   Smoking status: Every Day    Current packs/day: 0.50    Types: Cigarettes   Smokeless tobacco: Never  Vaping Use   Vaping status: Never Used  Substance and Sexual Activity   Alcohol use: Yes    Comment: socially   Drug use: Yes    Types: Marijuana   Sexual activity: Not on file  Other Topics Concern   Not on file  Social History Narrative   Not on file   Social Drivers of Health   Financial Resource Strain: Not on file  Food Insecurity: Low Risk  (09/23/2023)   Received from [facility]   Hunger Vital Sign    Worried About Running Out of Food in the Last Year: Never true    Ran Out of Food in the Last Year: Never true  Transportation Needs: No Transportation Needs (09/23/2023)   Received from [facility]    In the past 12 months, has lack of reliable transportation kept you from medical appointments, meetings, work or from getting things needed for daily living? : No  Physical Activity: Not on file  Stress: Not on file  Social Connections: Not on file    Additional Social History:   Allergies:  No Known Allergies  Metabolic Disorder Labs: Lab Results  Component Value Date   HGBA1C 5.8 (H) 09/18/2023   MPG 119.76 09/18/2023   No results found for: PROLACTIN Lab Results  Component Value Date   CHOL 195 09/18/2023   TRIG 136 09/18/2023   HDL 33 (L) 09/18/2023   CHOLHDL 5.9 09/18/2023   VLDL 27 09/18/2023   LDLCALC 135 (H) 09/18/2023   No results found for: TSH  Therapeutic Level Labs: No results found for: LITHIUM No results found for:  CBMZ No results found for: VALPROATE  Current Medications: Current Outpatient Medications  Medication Sig Dispense Refill   acetaminophen  (TYLENOL ) 500 MG tablet Take 500-1,000 mg by mouth every 6 (six) hours as needed for moderate pain (pain score 4-6).     DULoxetine  (CYMBALTA ) 30 MG capsule Take 1 capsule (30 mg total) by mouth daily. 30 capsule 0   finasteride (PROSCAR) 5 MG tablet Take 5 mg by mouth daily.     gabapentin  (NEURONTIN ) 300 MG capsule Take 300 mg by mouth at bedtime.     meclizine  (ANTIVERT ) 25 MG tablet Take 1 tablet (25 mg total) by mouth 3 (three) times daily as needed for dizziness. 30 tablet 0   Multiple Vitamin (MULTIVITAMIN WITH MINERALS) TABS tablet Take 1 tablet by mouth daily.     rosuvastatin  (CRESTOR ) 20 MG tablet Take 1 tablet (20 mg total) by mouth daily. 30 tablet 0   tamsulosin (FLOMAX) 0.4 MG CAPS capsule Take 0.4 mg by mouth daily.     No current facility-administered medications for this visit.    Musculoskeletal: Telehealth assessment  Psychiatric Specialty Exam: Review of Systems  Psychiatric/Behavioral:  Positive for decreased concentration and sleep disturbance. The patient is nervous/anxious.   All other systems reviewed and are negative.   There were no vitals taken for this visit.There is no height or weight on file to calculate BMI.  General Appearance: Casual  Eye Contact:  Good  Speech:  Clear and Coherent  Volume:  Normal  Mood:  Anxious and Depressed  Affect:  Congruent  Thought Process:  Coherent  Orientation:  Full (Time, Place, and Person)  Thought Content:  Logical  Suicidal Thoughts:  No  Homicidal Thoughts:  No  Memory:  Immediate;   Good Recent;   Good  Judgement:  Good  Insight:  Good  Psychomotor Activity:  Normal  Concentration:  Concentration: Good  Recall:  Good  Fund of Knowledge:Good  Language: Good  Akathisia:  No  Handed:  Right  AIMS (if indicated):  not done  Assets:  Communication Skills Desire  for Improvement  ADL's:  Intact  Cognition: WNL  Sleep:  Good   Screenings: PHQ2-9    Flowsheet Row Counselor from 09/30/2023 in BEHAVIORAL HEALTH PARTIAL HOSPITALIZATION PROGRAM Counselor from 09/11/2023 in BEHAVIORAL HEALTH PARTIAL HOSPITALIZATION PROGRAM  PHQ-2 Total Score 3 3  PHQ-9 Total Score 14 20      Flowsheet Row Counselor from 09/30/2023 in BEHAVIORAL HEALTH PARTIAL HOSPITALIZATION PROGRAM ED to Hosp-Admission (Discharged) from 09/17/2023 in [place] [facility] Counselor from 09/11/2023 in BEHAVIORAL HEALTH PARTIAL HOSPITALIZATION PROGRAM  C-SSRS RISK CATEGORY Error: Q3, 4, or 5 should not be populated when Q2 is No No Risk Error: Question 6 not populated       Assessment and Plan:  Patient to start intensive outpatient programming Continue medications as directed -All medications was refilled on 10/16/2023  Collaboration of Care: Medication Management AEB Cymbalta  hydroxyzine  Patient/Guardian was advised Release of Information must be obtained prior to any record release in order to collaborate their care with an outside provider. Patient/Guardian was advised if they have not already done so to contact the registration department to sign all necessary forms in order for us  to release information regarding their care.   Consent: Patient/Guardian gives verbal consent for treatment and assignment of benefits for services provided during this visit. Patient/Guardian expressed understanding and agreed to proceed.   Staci LOISE Kerns, NP 1/3/202511:17 AM

## 2023-11-02 ENCOUNTER — Other Ambulatory Visit (HOSPITAL_COMMUNITY): Payer: PRIVATE HEALTH INSURANCE | Admitting: Licensed Clinical Social Worker

## 2023-11-02 DIAGNOSIS — F332 Major depressive disorder, recurrent severe without psychotic features: Secondary | ICD-10-CM

## 2023-11-02 NOTE — Progress Notes (Signed)
 Virtual Visit via Video Note   I connected with Ferdie BIRCH. Puleo on 11/02/23 at  9:00 AM EDT by a video enabled telemedicine application and verified that I am speaking with the correct person using two identifiers.   At orientation to the IOP program, Case Manager discussed the limitations of evaluation and management by telemedicine and the availability of in person appointments. The patient expressed understanding and agreed to proceed with virtual visits throughout the duration of the program.   Location:  Patient: Patient Home Provider: Home Office   History of Present Illness: MDD   Observations/Objective: Check In: Case Manager checked in with all participants to review discharge dates, insurance authorizations, work-related documents and needs from the treatment team regarding medications. Levar stated needs and engaged in discussion.    Initial Therapeutic Activity: Counselor facilitated a check-in with Sigfredo to assess for safety, sobriety and medication compliance.  Counselor also inquired about Zaeem's current emotional ratings, as well as any significant changes in thoughts, feelings or behavior since previous check in.  Ghassan presented for session on time and was alert, oriented x5, with no evidence or self-report of active SI/HI or A/V H.  Yassir reported compliance with medication and denied use of alcohol or illicit substances.  Davelle reported scores of 1/10 for depression, 3/10 for anxiety, and 0/10 for anger/irritability.  Betzalel denied any recent outbursts or panic attacks.  Cardin reported that a recent success was seeing a play with family on Friday, stating "It was good".  Aztlan reported that a struggle was having trouble with switching pharmacies.  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          Second Therapeutic Activity: Counselor covered topic of conflict resolution today.  Counselor virtually shared a  handout on subject with members which warned against the 'four horsemen' of communication traps that should be avoided due to tendency to escalate and damage a relationship.  These included criticism, defensiveness, contempt, and stonewalling.  'Antidotes' to these harmful behaviors were offered as healthy replacements to improve communication and understanding, including approaching problems with a gentle startup approach, taking responsibility for one's behavior, sharing fondness/admiration, and using self-soothing to calm down and focus on the problem at hand.  Counselor encouraged members to share recent experiences with conflict that they have faced, which approach they utilized, and any changes that they would plan to implement in order to improve overall conflict resolution skills.  Intervention effectiveness could not be measured, as client did not participate.      Assessment and Plan: Counselor recommends that [place] remain in IOP treatment to better manage mental health symptoms, ensure stability and pursue completion of treatment plan goals. Counselor recommends adherence to crisis/safety plan, taking medications as prescribed, and following up with medical professionals if any issues arise.    Follow Up Instructions: Counselor will send Webex link for session tomorrow.  Alexandr was advised to call back or seek an in-person evaluation if the symptoms worsen or if the condition fails to improve as anticipated.   Collaboration of Care:   Medication Management AEB [name], NP or Dr. [name]                                          Case Manager AEB [name], CNA    Patient/Guardian was advised Release  of Information must be obtained prior to any record release in order to collaborate their care with an outside provider. Patient/Guardian was advised if they have not already done so to contact the registration department to sign all necessary forms in order for us  to release information  regarding their care.    Consent: Patient/Guardian gives verbal consent for treatment and assignment of benefits for services provided during this visit. Patient/Guardian expressed understanding and agreed to proceed.   I provided 180 minutes of non-face-to-face time during this encounter.   [name], LCSW, LCAS 11/02/23

## 2023-11-03 ENCOUNTER — Other Ambulatory Visit (HOSPITAL_COMMUNITY): Payer: PRIVATE HEALTH INSURANCE | Admitting: Psychiatry

## 2023-11-03 DIAGNOSIS — F332 Major depressive disorder, recurrent severe without psychotic features: Secondary | ICD-10-CM | POA: Diagnosis not present

## 2023-11-03 NOTE — Progress Notes (Signed)
 Virtual Visit via Video Note   I connected with Ferdie BIRCH. Tays on 11/03/23 at  9:00 AM EDT by a video enabled telemedicine application and verified that I am speaking with the correct person using two identifiers.   At orientation to the IOP program, Case Manager discussed the limitations of evaluation and management by telemedicine and the availability of in person appointments. The patient expressed understanding and agreed to proceed with virtual visits throughout the duration of the program.   Location:  Patient: Patient Home Provider: [facility]   History of Present Illness: MDD   Observations/Objective: Check In: Case Manager checked in with all participants to review discharge dates, insurance authorizations, work-related documents and needs from the treatment team regarding medications. Valerio stated needs and engaged in discussion.    Initial Therapeutic Activity: Counselor facilitated a check-in with Calven to assess for safety, sobriety and medication compliance.  Counselor also inquired about Icholas's current emotional ratings, as well as any significant changes in thoughts, feelings or behavior since previous check in.  Bryar presented for session on time and was alert, oriented x5, with no evidence or self-report of active SI/HI or A/V H.  Macguire reported compliance with medication and denied use of alcohol or illicit substances.  Damien reported scores of 2/10 for depression, 0/10 for anxiety, and 0/10 for anger/irritability.  Jehad denied any recent outbursts or panic attacks.  Nevyn reported that a struggle was having to go to the vet yesterday because his dog wasn't acting normally.  Glenford reported that a success was getting a clean bill of health on his dog from the vet, stating "I guess I made a mountain out of a molehill".  Parke reported that his goal today is to complete paperwork for our program and drop it off at the office.            Second  Therapeutic Activity: Counselor introduced [name], [facility] Chaplain to provide psychoeducation on topic of Grief and Loss with members today.  [name] began discussion by checking in with the group about their baseline mood today, general thoughts on what grief means to them and how it has affected them personally in the past.  [name] provided information on how the process of grief/loss can differ depending upon one's unique culture, and categories of loss one could experience (i.e. loss of a person, animal, relationship, job, identity, etc).  [name] encouraged members to be mindful of how pervasive loss can be, and how to recognize signs which could indicate that this is having an impact on one's overall mental health and wellbeing.  Intervention effectiveness was mixed, as D'Arcy did not elaborate on his own experience with grief, but did participate in an icebreaker activity.    Third Therapeutic Activity: Counselor covered topic of distress tolerance skills today.  Counselor utilized a DBT handout which explained how distressing situations don't always have quick solutions, so the only choice is to sit with uncomfortable emotions until they pass.  Counselor offered the IMPROVE acronym as a solution to this problem, which outlined various skills (i.e. Imagery, Meaning, Prayer, Relaxation, 'One thing in the moment', Vacation, and Encouragement) that could be explored in order to improve ability to tolerate discomfort.  Counselor tasked members with identifying personalized strategies for each category which could have been implemented to handle a recent challenge more effectively.  Intervention was effective, as evidenced by Ferdie actively engaging in discussion on subject, reporting that he considers failing at something to be distressing.  Brook was able to identify several strategies for handling a similar challenge in the future, including praying to his higher power for guidance, engaging in  gratitude practice, imagining himself on a warm beach, looking for positive aspects of his struggle, taking a hot shower, doing dishes or vacuuming around the house, or take a walk outside to clear his mind.  Brondon reported that he could also tell himself words of encouragement, such as "I've made it through worse".  Assessment and Plan: Counselor recommends that Bourg remain in IOP treatment to better manage mental health symptoms, ensure stability and pursue completion of treatment plan goals. Counselor recommends adherence to crisis/safety plan, taking medications as prescribed, and following up with medical professionals if any issues arise.    Follow Up Instructions: Counselor will send Webex link for session tomorrow.  Raheel was advised to call back or seek an in-person evaluation if the symptoms worsen or if the condition fails to improve as anticipated.   Collaboration of Care:   Medication Management AEB [name], NP or Dr. [name]                                          Case Manager AEB [name], CNA    Patient/Guardian was advised Release of Information must be obtained prior to any record release in order to collaborate their care with an outside provider. Patient/Guardian was advised if they have not already done so to contact the registration department to sign all necessary forms in order for us  to release information regarding their care.    Consent: Patient/Guardian gives verbal consent for treatment and assignment of benefits for services provided during this visit. Patient/Guardian expressed understanding and agreed to proceed.   I provided 180 minutes of non-face-to-face time during this encounter.   [name], LCSW, LCAS 11/03/23

## 2023-11-04 ENCOUNTER — Other Ambulatory Visit (HOSPITAL_COMMUNITY): Payer: PRIVATE HEALTH INSURANCE | Admitting: Licensed Clinical Social Worker

## 2023-11-04 DIAGNOSIS — F332 Major depressive disorder, recurrent severe without psychotic features: Secondary | ICD-10-CM

## 2023-11-04 NOTE — Progress Notes (Signed)
 Virtual Visit via Video Note   I connected with Derek Burns on 11/04/23 at  9:00 AM EDT by a video enabled telemedicine application and verified that I am speaking with the correct person using two identifiers.   At orientation to the IOP program, Case Manager discussed the limitations of evaluation and management by telemedicine and the availability of in person appointments. The patient expressed understanding and agreed to proceed with virtual visits throughout the duration of the program.   Location:  Patient: Patient Home Provider: [facility]   History of Present Illness: MDD   Observations/Objective: Check In: Case Manager checked in with all participants to review discharge dates, insurance authorizations, work-related documents and needs from the treatment team regarding medications. Derek Burns stated needs and engaged in discussion.    Initial Therapeutic Activity: Counselor facilitated a check-in with Derek Burns to assess for safety, sobriety and medication compliance.  Counselor also inquired about Derek Burns's current emotional ratings, as well as any significant changes in thoughts, feelings or behavior since previous check in.  Derek Burns presented for session on time and was alert, oriented x5, with no evidence or self-report of active SI/HI or A/V H.  Derek Burns reported compliance with medication and denied use of alcohol or illicit substances.  Derek Burns reported scores of 2/10 for depression, 0/10 for anxiety, and 0/10 for anger/irritability.  Derek Burns denied any recent outbursts or panic attacks.  Derek Burns reported that a recent success was taking the dog for a walk, and then taking care of some tasks around the house yesterday.  Derek Burns reported that a struggle was learning that his insurance denied short term disability.  Derek Burns reported that his goal today is to finish some paperwork after group.        Second Therapeutic Activity: Counselor introduced Derek Burns, [facility] Pharmacist, to  provide psychoeducation on topic of medication compliance with members today.  Derek provided psychoeducation on classes of medications such as antidepressants, antipsychotics, what symptoms they are intended to treat, and any side effects one might encounter while on a particular prescription.  Time was allowed for clients to ask any questions they might have of [facility] regarding this specialty.  Intervention effectiveness could not be measured, as client did not participate.    Third Therapeutic Activity: Counselor introduced topic of grounding skills today.  Counselor defined these as simple strategies one can use to help detach from difficult thoughts or feelings temporarily by focusing on something else.  Counselor noted that grounding will not solve the problem at hand, but can provide the practitioner with time to regain control over their thoughts and/or feelings and prevent the situation from getting worse (i.e. interrupting a panic attack).  Counselor divided these into three categories (mental, physical, and soothing) and then provided examples of each which group members could practice during session.  Some of these included describing one's environment in detail or playing a categories game with oneself for mental category, taking a hot bath/shower, stretching, or carrying a grounding object for physical category, and saying kind statements, or visualizing people one cares about for soothing category.  Counselor inquired about which techniques members have used with success in the past, or will commit to learning, practicing, and applying now to improve coping abilities.  Intervention was effective, as evidenced by Derek participating in discussion on the subject, trying out several of the techniques during session, and expressing interest in adding several to his available coping skills, such as describing an everyday activity in great detail, visualizing himself  relaxing on the beach with waves  crashing under the warm sunlight, reading something boring that will make him fall asleep, counting to 10, using a piece of fabric as a grounding object, and stretching.   Assessment and Plan: Counselor recommends that [place] remain in IOP treatment to better manage mental health symptoms, ensure stability and pursue completion of treatment plan goals. Counselor recommends adherence to crisis/safety plan, taking medications as prescribed, and following up with medical professionals if any issues arise.    Follow Up Instructions: Counselor will send Webex link for session tomorrow.  Derek Burns was advised to call back or seek an in-person evaluation if the symptoms worsen or if the condition fails to improve as anticipated.   Collaboration of Care:   Medication Management AEB [name], NP or Dr. [name]                                          Case Manager AEB [name], CNA    Patient/Guardian was advised Release of Information must be obtained prior to any record release in order to collaborate their care with an outside provider. Patient/Guardian was advised if they have not already done so to contact the registration department to sign all necessary forms in order for us  to release information regarding their care.    Consent: Patient/Guardian gives verbal consent for treatment and assignment of benefits for services provided during this visit. Patient/Guardian expressed understanding and agreed to proceed.   I provided 180 minutes of non-face-to-face time during this encounter.   [name], LCSW, LCAS 11/04/23

## 2023-11-05 ENCOUNTER — Other Ambulatory Visit (HOSPITAL_COMMUNITY): Payer: PRIVATE HEALTH INSURANCE | Admitting: Licensed Clinical Social Worker

## 2023-11-05 DIAGNOSIS — F332 Major depressive disorder, recurrent severe without psychotic features: Secondary | ICD-10-CM

## 2023-11-05 NOTE — Progress Notes (Signed)
 Virtual Visit via Video Note   I connected with Derek Burns on 11/05/23 at  9:00 AM EDT by a video enabled telemedicine application and verified that I am speaking with the correct person using two identifiers.   At orientation to the IOP program, Case Manager discussed the limitations of evaluation and management by telemedicine and the availability of in person appointments. The patient expressed understanding and agreed to proceed with virtual visits throughout the duration of the program.   Location:  Patient: Patient Home Provider: [facility]   History of Present Illness: MDD   Observations/Objective: Check In: Case Manager checked in with all participants to review discharge dates, insurance authorizations, work-related documents and needs from the treatment team regarding medications. Derek Burns stated needs and engaged in discussion.    Initial Therapeutic Activity: Counselor facilitated a check-in with Derek Burns to assess for safety, sobriety and medication compliance.  Counselor also inquired about Derek Burns's current emotional ratings, as well as any significant changes in thoughts, feelings or behavior since previous check in.  Derek Burns presented for session on time and was alert, oriented x5, with no evidence or self-report of active SI/HI or A/V H.  Derek Burns reported compliance with medication and denied use of alcohol or illicit substances.  Derek Burns reported scores of 1/10 for depression, 2/10 for anxiety, and 0/10 for anger/irritability.  Derek Burns denied any recent outbursts or panic attacks.  Derek Burns reported that a struggle was starting to feel sick yesterday, including hot and cold flashes.  Derek Burns reported that an additional struggle has been trying to get prescriptions switched from pharmacies.  Derek Burns reported that his goal today is to attend a doctor's appointment after group.        Second Therapeutic Activity: Counselor introduced topic of self-esteem today and defined  this as the value an individual places on oneself, based upon assessment of personal worth as a human being and approval/disapproval of one's behavior. Counselor asked members to assess their level of self-esteem at this time based upon common indicators of high self-esteem, including: accepting oneself unconditionally;  having self-respect and deep seated belief that one matters; being unaffected by other people's opinions/criticisms; and showing good control over emotions.  Counselor also explained concept of one's inner critic which serves to highlight faults and minimize strengths, directly influencing low sense of self-esteem.  Counselor then provided handout on 'strengths and qualities', which featured questions to guide discussion and increase awareness of each member's unique individual abilities which could reinforce higher self-esteem. Examples of questions included: 'things I am good at', 'challenges I have overcome', and 'what I like about myself'.  Intervention was effective, as evidenced by Derek actively engaging in discussion on topic, and completing a self-esteem assessment, receiving a score of 17, which indicated a 'moderate' level of self-esteem at this time due to traits such as being filled with fears, particularly about the future, and constantly seeking approval from others in his network.  Derek Burns stated "I feel like my self-esteem should be a little bit higher".  Derek Burns was receptive to several strategies offered today for increasing self-esteem during treatment, including more positive self-care activities in his routine, being kinder to himself via positive self-talk, recognizing personal strengths such as spirituality, humor, and patience; and treating himself to indulgences such as eating at nice restaurants with family.  Assessment and Plan: Counselor recommends that Derek Burns remain in IOP treatment to better manage mental health symptoms, ensure stability and pursue completion of  treatment plan goals. Counselor recommends adherence to  crisis/safety plan, taking medications as prescribed, and following up with medical professionals if any issues arise.    Follow Up Instructions: Counselor will send Webex link for session tomorrow.  Derek Burns was advised to call back or seek an in-person evaluation if the symptoms worsen or if the condition fails to improve as anticipated.   Collaboration of Care:   Medication Management AEB Derek Kerns, NP or Dr. Prentice Burns                                          Case Manager AEB Derek Gaskins, CNA    Patient/Guardian was advised Release of Information must be obtained prior to any record release in order to collaborate their care with an outside provider. Patient/Guardian was advised if they have not already done so to contact the registration department to sign all necessary forms in order for us  to release information regarding their care.    Consent: Patient/Guardian gives verbal consent for treatment and assignment of benefits for services provided during this visit. Patient/Guardian expressed understanding and agreed to proceed.   I provided 180 minutes of non-face-to-face time during this encounter.   Derek Ricker, LCSW, LCAS 11/05/23

## 2023-11-06 ENCOUNTER — Other Ambulatory Visit (HOSPITAL_COMMUNITY): Payer: PRIVATE HEALTH INSURANCE | Admitting: Licensed Clinical Social Worker

## 2023-11-06 DIAGNOSIS — F332 Major depressive disorder, recurrent severe without psychotic features: Secondary | ICD-10-CM

## 2023-11-09 ENCOUNTER — Other Ambulatory Visit (HOSPITAL_COMMUNITY): Payer: PRIVATE HEALTH INSURANCE | Admitting: Psychiatry

## 2023-11-09 DIAGNOSIS — F332 Major depressive disorder, recurrent severe without psychotic features: Secondary | ICD-10-CM

## 2023-11-09 NOTE — Patient Instructions (Signed)
 D:  Patient is requesting discharge tomorrow (11-10-23).  A:  Discharge patient on 11-10-23.  Follow up with Dr. Franky Molt @ [facility].  Inquire about a referral to an [facility] therapist with Dr. Molt.  Recommend support groups through [employer] [phone].  Return to work whenever disability company states.  R:  Patient receptive.

## 2023-11-09 NOTE — Progress Notes (Signed)
 Virtual Visit via Video Note   I connected with Ferdie BIRCH. [place] on 11/09/23 at  9:00 AM EDT by a video enabled telemedicine application and verified that I am speaking with the correct person using two identifiers.   At orientation to the IOP program, Case Manager discussed the limitations of evaluation and management by telemedicine and the availability of in person appointments. The patient expressed understanding and agreed to proceed with virtual visits throughout the duration of the program.   Location:  Patient: Patient Home Provider: Home Office   History of Present Illness: MDD   Observations/Objective: Check In: Case Manager checked in with all participants to review discharge dates, insurance authorizations, work-related documents and needs from the treatment team regarding medications. Keigan stated needs and engaged in discussion.    Initial Therapeutic Activity: Counselor facilitated a check-in with [place] to assess for safety, sobriety and medication compliance.  Counselor also inquired about Edwar's current emotional ratings, as well as any significant changes in thoughts, feelings or behavior since previous check in.  Cordarious presented for session on time and was alert, oriented x5, with no evidence or self-report of active SI/HI or A/V H.  Kensley reported compliance with medication and denied use of alcohol or illicit substances.  Clifford reported scores of 1/10 for depression, 1/10 for anxiety, and 0/10 for anger/irritability.  Dina denied any recent outbursts or panic attacks.  Jojo reported that a recent success was staying safe last week when it snowed and became very icy afterward.  Sony reported that a struggle was making the decision to end his time in [facility] tomorrow so that he can go assist family in [place]  following a health emergency.  Forrest reported that his goal today is to attend a doctor's appointment, than start packing for his trip to [place]  on  Wednesday.        Second Therapeutic Activity: Counselor introduced topic of self-care today.  Counselor explained how this can be defined as the things one does to maintain good health and improve well-being.  Counselor provided members with a self-care assessment form to complete.  This handout featured various sub-categories of self-care, including physical, psychological/emotional, social, spiritual, and professional.  Members were asked to rank their engagement in the activities listed for each dimension on a scale of 1-3, with 1 indicating 'Poor', 2 indicating 'Ok', and 3 indicating 'Well'.  Counselor invited members to share results of their assessment, and inquired about which areas of self-care they are doing well in, as well as areas that require attention, and how they plan to begin addressing this during treatment.  Intervention was effective, as evidenced by Ferdie successfully completing initial 2 sections of assessment and actively engaging in discussion on subject, reporting that he is excelling in areas such as eating healthy foods, maintaining personal hygiene, exercising, getting enough sleep, going to preventative medical appointments, resting when sick, taking time off from work, participating in hobbies, finding reasons to laugh, and going on vacations or daytrips, but would benefit from focusing more on areas such as wearing clothes that make him feel good, eating regularly, participating in fun activities, and expressing feelings in a healthy way.  Antawan reported that he would work to improve self-care deficits by chartered certified accountant choices that boost self-image, making sure that he eats at least 3 meals a day to ensure adequate nutrition, and using group and individual therapy as a safe space to express his feelings and build support.    Assessment and Plan: Counselor recommends  that [place] remain in IOP treatment to better manage mental health symptoms, ensure stability and  pursue completion of treatment plan goals. Counselor recommends adherence to crisis/safety plan, taking medications as prescribed, and following up with medical professionals if any issues arise.    Follow Up Instructions: Counselor will send Webex link for session tomorrow.  Deaunte was advised to call back or seek an in-person evaluation if the symptoms worsen or if the condition fails to improve as anticipated.   Collaboration of Care:   Medication Management AEB [name], NP or Dr. [name]                                          Case Manager AEB [name], CNA    Patient/Guardian was advised Release of Information must be obtained prior to any record release in order to collaborate their care with an outside provider. Patient/Guardian was advised if they have not already done so to contact the registration department to sign all necessary forms in order for us  to release information regarding their care.    Consent: Patient/Guardian gives verbal consent for treatment and assignment of benefits for services provided during this visit. Patient/Guardian expressed understanding and agreed to proceed.   I provided 180 minutes of non-face-to-face time during this encounter.   [name], [place], LCAS 11/09/23

## 2023-11-10 ENCOUNTER — Other Ambulatory Visit (HOSPITAL_COMMUNITY): Payer: PRIVATE HEALTH INSURANCE | Admitting: Psychiatry

## 2023-11-10 DIAGNOSIS — F332 Major depressive disorder, recurrent severe without psychotic features: Secondary | ICD-10-CM | POA: Diagnosis not present

## 2023-11-10 NOTE — Progress Notes (Signed)
 Virtual Visit via Video Note  I connected with [name] on @TODAY @ at  9:00 AM EST by a video enabled telemedicine application and verified that I am speaking with the correct person using two identifiers.  Location: Patient: at home Provider: at office   I discussed the limitations of evaluation and management by telemedicine and the availability of in person appointments. The patient expressed understanding and agreed to proceed.  I discussed the assessment and treatment plan with the patient. The patient was provided an opportunity to ask questions and all were answered. The patient agreed with the plan and demonstrated an understanding of the instructions.   The patient was advised to call back or seek an in-person evaluation if the symptoms worsen or if the condition fails to improve as anticipated.  I provided 30 minutes of non-face-to-face time during this encounter.   GRETTA, RITA, M.Ed, CNA   Patient ID: RYMAN RATHGEBER, male   DOB: 1966/08/09, 58 y.o.   MRN: [id number] As per previous CCA states:  Jermery Caratachea is a 58yo male referred to [facility] by Dr. Barbra due to depression related to severe chronic pain. He cites his stressor as chronic pain and reports passive SI over the past month, but denies SI at this time. He cites his faith as a protective factor. He reports he has been struggling with depression for the past couple of years. He endorses normal ADLs and denies previous mental health tx, hospitalizations, suicide attempts, diagnoses, NSSI, family history, HI, AVH, substance use, and history of substance abuse. He cites his wife as his support, with whom he currently lives. He reports he has spinal stenosis, scoliosis, degenerative disc disease, and arthritis and also states he has had three cervical discs removed and numerous injections. He rates his current pain level at a 9 on a scale from 1-10 with 10 being the worst. He states he has firearms in his home and  they are in a safe that he reports cannot readily access at this time due to the code not working. Cln provides education about suicide risk and having firearms in the home and pt verbalizes understanding. He is recommended for PHP due to recent SI, depressive symptoms, severe chronic pain, and access to means, all of which increase his risk for dying by suicide.   Patient stepped down from virtual PHP to virtual MH-IOP on 10-29-23.  Reports feeling helpless today d/t a phone call he received yesterday from his mother re: stepfather.  My stepfather and mother live a thousand miles away and apparently on Monday he had an episode in which he fell out.  Pt states tests are being ran, but they don't know what happened to him.  According to pt, he has had one prior episode. Pt states other than that happening, his New Year's Eve/Day was fine.  On a scale of 1-10; pt rates his depression at a 3 and anxiety at a 4.  Denies SI/HI or A/V hallucinations.    Pt is requesting discharge today, in order to go to [place] .  His stepfather is in the hospital and his mother needs assistance.  Pt plans to fly out on this Thursday.   Pt attended #8 days in virtual MH-IOP.  States he is feeling better overall.  I learned a lot of things.  I have new tools.  On a scale of 1-10 (10 being the worst); pt rates his depression and anxiety at a 4-5.  Denies SI/HI or A/V hallucinations.  Reports that the groups were helpful.  Pt is still awaiting to hear back from the disability company re: obtaining a check. A:  Discharge patient today.  Pt will f/u with Dr. Franky Molt @ [facility] and inquire about a therapist through [facility] via Dr. Molt or insurance company.  Pt was advised of ROI must be obtained prior to any records release in order to collaborate his care with an outside provider.  Pt was advised if he has not already done so to contact the front desk to sign all necessary forms in order for MH-IOP to release info re: his care.   Consent:  Pt gives verbal consent for tx and assignment of benefits for services provided during this telehealth group process.  Pt expressed understanding and agreed to proceed. Collaboration of care:  Collaborate with Dr. Laurann HAIR,  [name], NP AEB; Dr. Franky Molt AEB; [name], LCSW AEB,   Encouraged support groups through [employer].  R:  Pt receptive.  [name], M.Ed,CNA

## 2023-11-10 NOTE — Progress Notes (Signed)
 Virtual Visit via Video Note   I connected with Derek Burns. Derek Burns on 11/10/23 at  9:00 AM EDT by a video enabled telemedicine application and verified that I am speaking with the correct person using two identifiers.   At orientation to the IOP program, Case Manager discussed the limitations of evaluation and management by telemedicine and the availability of in person appointments. The patient expressed understanding and agreed to proceed with virtual visits throughout the duration of the program.   Location:  Patient: Patient Home Provider: [facility] Office   History of Present Illness: MDD   Observations/Objective: Check In: Case Manager checked in with all participants to review discharge dates, insurance authorizations, work-related documents and needs from the treatment team regarding medications. Derek Burns stated needs and engaged in discussion.    Initial Therapeutic Activity: Counselor facilitated a check-in with Derek Burns to assess for safety, sobriety and medication compliance.  Counselor also inquired about Derek Burns's current emotional ratings, as well as any significant changes in thoughts, feelings or behavior since previous check in.  Derek Burns presented for session on time and was alert, oriented x5, with no evidence or self-report of active SI/HI or A/V H.  Derek Burns reported compliance with medication and denied use of alcohol or illicit substances.  Derek Burns reported scores of 1/10 for depression, 1/10 for anxiety, and 0/10 for anger/irritability.  Derek Burns denied any recent outbursts or panic attacks.  Derek Burns reported that a recent success was learning that a family member will be released from the hospital soon, which has him feeling hopeful.  Derek Burns denied any new struggles at this time.  Derek Burns reported that his goal today is to continue packing to prepare for his trip, and drop off some paperwork for his job, stating "I have to tie up some loose ends".     Second Therapeutic Activity:  Counselor introduced Derek Burns, [facility] to provide psychoeducation on topic of Grief and Loss with members today.  Derek Burns began discussion by checking in with the group about their baseline mood today, general thoughts on what grief means to them and how it has affected them personally in the past.  Derek Burns provided information on how the process of grief/loss can differ depending upon one's unique culture, and categories of loss one could experience (i.e. loss of a person, animal, relationship, job, identity, etc).  Derek Burns encouraged members to be mindful of how pervasive loss can be, and how to recognize signs which could indicate that this is having an impact on one's overall mental health and wellbeing.  Intervention effectiveness was mixed, as Derek Burns did not participate in discussion with speaker on the subject of grief, but did respond to an icebreaker question.    Third Therapeutic Activity: Counselor invited members to participate in peaceful place guided imagery activity today.  Counselor explained how this is a powerful visualization tool which can aid in reducing stress while increasing sense of calm, control, and awareness if practiced regularly.  Counselor informed members beforehand that if they became uncomfortable at any point during activity, they could stop and open their eyes.  Counselor invited members to get comfortable, achieve a relaxing breathing rhythm, close their eyes, and then guided them through process of creating a 'peaceful place' which filled them with safety and calm.  Counselor encouraged members to include sensory details involving vision, sound, touch, smell, and taste which they considered pleasant to enhance experience.  After practicing in session, counselor invited members to share their opinion on the activity, including whether  they were able to imagine a specific place, what details stood out to them, and how this made them feel during and after.   Intervention was effective, as evidenced by Derek successfully participating in activity, and reporting that he was able to imagine standing in a field with some dogs on a warm day, which put him at ease.  He reported that he could feel the sun on his skin, the breeze from the wind, and smell the tall grass.  Derek Burns reported that he enjoyed this, and would practice it again as a new coping skill.     Assessment and Plan: Derek Burns has asked to be discharged from [facility] early in order to go to [place]  and assist family.  Counselor recommends adherence to crisis/safety plan, taking medications as prescribed, and following up with medical professionals if any issues arise.    Follow Up Instructions: Derek Burns was advised to call back or seek an in-person evaluation if the symptoms worsen or if the condition fails to improve as anticipated.   Collaboration of Care:   Medication Management AEB Derek Kerns, NP or Dr. Prentice Burns                                          Case Manager AEB Derek Gaskins, CNA    Patient/Guardian was advised Release of Information must be obtained prior to any record release in order to collaborate their care with an outside provider. Patient/Guardian was advised if they have not already done so to contact the registration department to sign all necessary forms in order for us  to release information regarding their care.    Consent: Patient/Guardian gives verbal consent for treatment and assignment of benefits for services provided during this visit. Patient/Guardian expressed understanding and agreed to proceed.   I provided 180 minutes of non-face-to-face time during this encounter.   Derek Burns, [place], LCAS 11/10/23

## 2023-11-10 NOTE — Progress Notes (Signed)
 Virtual Visit via Telephone Note  I connected with Derek Burns on 11/10/23 at  9:00 AM EST by telephone and verified that I am speaking with the correct person using two identifiers.  Location: Patient: Home Provider: Office   I discussed the limitations, risks, security and privacy concerns of performing an evaluation and management service by telephone and the availability of in person appointments. I also discussed with the patient that there may be a patient responsible charge related to this service. The patient expressed understanding and agreed to proceed.   I discussed the assessment and treatment plan with the patient. The patient was provided an opportunity to ask questions and all were answered. The patient agreed with the plan and demonstrated an understanding of the instructions.   The patient was advised to call back or seek an in-person evaluation if the symptoms worsen or if the condition fails to improve as anticipated.  I provided 15 minutes of non-face-to-face time during this encounter.   Derek LOISE Kerns, NP   [facility] Intensive [facility]patient Program Discharge Summary  Derek Burns [id number]  Admission date: 10/29/2023 Discharge date: 11/10/2023  Reason for admission: per admission assessment note: Derek Burns 58 year old Caucasian male seen and evaluated via video teleassessment. He presents to start partial hospitalization programming. Reports he has been struggling with pain for the past 5+ years. 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   Progress in Program Toward Treatment Goals: Progressing patient attended and participated with daily group session with active and engaged participation.  Denying any suicidal or homicidal ideations at discharge.  Denies auditory visual hallucinations.  Reports talk therapy has been helpful since he recently completed  partial hospitalization programming and is nearing the end of intensive outpatient programming.  No safety concerns noted at discharge.  Derek Burns reports plans to follow-up with therapy and psychiatry services through [facility].  States he is headed to [place]  to assist his mother as he reports his stepfather is in the hospital.  No refills noted at discharge.  Progress (rationale): Keep all outpatient follow-up appointments.  Take all of you medications as prescribed by your mental healthcare provider.  Report any adverse effects and reactions from your medications to your outpatient provider promptly.  Do not engage in alcohol and or illegal drug use while on prescription medicines. Keep all scheduled appointments. This is to ensure that you are getting refills on time and to avoid any interruption in your medication.  If you are unable to keep an appointment call to reschedule.  Be sure to follow up with resources and follow ups given. In the event of worsening symptoms call the crisis hotline, 911, and or go to the nearest emergency department for appropriate evaluation and treatment of symptoms. Follow-up with your primary care provider for your medical issues, concerns and or health care needs.    Collaboration of Care: Psychiatrist AEB Dr. Joshua Drivers at [facility]    Patient/Guardian was advised Release of Information must be obtained prior to any record release in order to collaborate their care with an outside provider. Patient/Guardian was advised if they have not already done so to contact the registration department to sign all necessary forms in order for us  to release information regarding their care.   Consent: Patient/Guardian gives verbal consent for treatment and assignment of benefits for services provided during this visit. Patient/Guardian expressed understanding and agreed to proceed.   Derek Burns  Derek Perot NP 11/10/2023

## 2023-11-11 ENCOUNTER — Other Ambulatory Visit (HOSPITAL_COMMUNITY): Payer: PRIVATE HEALTH INSURANCE

## 2023-11-12 ENCOUNTER — Other Ambulatory Visit (HOSPITAL_COMMUNITY): Payer: PRIVATE HEALTH INSURANCE

## 2023-11-13 ENCOUNTER — Other Ambulatory Visit (HOSPITAL_COMMUNITY): Payer: PRIVATE HEALTH INSURANCE

## 2023-11-16 ENCOUNTER — Other Ambulatory Visit (HOSPITAL_COMMUNITY): Payer: PRIVATE HEALTH INSURANCE

## 2023-11-17 ENCOUNTER — Other Ambulatory Visit (HOSPITAL_COMMUNITY): Payer: PRIVATE HEALTH INSURANCE

## 2023-11-18 ENCOUNTER — Other Ambulatory Visit (HOSPITAL_COMMUNITY): Payer: PRIVATE HEALTH INSURANCE

## 2023-11-19 ENCOUNTER — Other Ambulatory Visit (HOSPITAL_COMMUNITY): Payer: PRIVATE HEALTH INSURANCE

## 2023-11-20 ENCOUNTER — Other Ambulatory Visit (HOSPITAL_COMMUNITY): Payer: PRIVATE HEALTH INSURANCE

## 2023-12-09 NOTE — Progress Notes (Signed)
Virtual Visit via Video Note  I connected with Derek Burns on 11/06/23 at  9:00 AM EST by a video enabled telemedicine application and verified that I am speaking with the correct person using two identifiers.  Location: Patient: patient home Provider: clinical home office   I discussed the limitations of evaluation and management by telemedicine and the availability of in person appointments. The patient expressed understanding and agreed to proceed.   I discussed the assessment and treatment plan with the patient. The patient was provided an opportunity to ask questions and all were answered. The patient agreed with the plan and demonstrated an understanding of the instructions.   The patient was advised to call back or seek an in-person evaluation if the symptoms worsen or if the condition fails to improve as anticipated.  Pt was provided 180 minutes of non-face-to-face time during this encounter.   Donia Guiles, LCSW   Daily Group Progress Note  Program: IOP  Group Time: 9:00 - 10:30  Participation Level: Active  Behavioral Response: Appropriate and Sharing  Type of Therapy:  Group Therapy  Summary of Progress: Clinician led check-in regarding current stressors and situation, and review of patient completed daily inventory. Clinician utilized active listening and empathetic response and validated patient emotions. Clinician facilitated processing group on pertinent issues.?  Patient arrived within time allowed. Patient rates his mood at a 9 on a scale of 1-10 with 10 being best. Pt states he feels "pretty good." Pt states he slept 8 hours and ate 3x. Pt states yesterday was busy for him and he went to a doctor's appointment and saw his brother. Pt reports stress re: his mother currently living alone in [place] and having some health problems. Pt shares he is not getting the support he hoped from his step-brother and will likely have to support his mom alone. Pt reports  he is "trying to stay positive." Pt able to process. Pt engaged in discussion.   Progress Towards Goals: Progressing   Group Time: 10:30 - 12:00  Participation Level:  Active  Behavioral Response: Appropriate and Sharing  Type of Therapy: Group Therapy  Summary of Progress: Cln led discussion on support groups and the role they can provide in recovery and ongoing treatment. A. Espitia from [facility] spoke with group about peer support options offered. Group discussed success and barriers they have had with past support groups or they have with considering support groups. Cln provided resources for local support groups.  Pt engaged in discussion and reports interest in engaging in support groups.    Progress Towards Goals: Progressing   Donia Guiles, LCSW

## 2024-09-24 ENCOUNTER — Other Ambulatory Visit (HOSPITAL_COMMUNITY): Payer: Self-pay | Admitting: Family

## 2024-09-24 DIAGNOSIS — F332 Major depressive disorder, recurrent severe without psychotic features: Secondary | ICD-10-CM
